# Patient Record
Sex: Male | Born: 1970
Health system: Southern US, Community
[De-identification: ages and names within clinical notes are randomized; demographics above are authoritative.]

## PROBLEM LIST (undated history)

## (undated) DIAGNOSIS — K219 Gastro-esophageal reflux disease without esophagitis: Secondary | ICD-10-CM

## (undated) HISTORY — PX: APPENDECTOMY: SHX54

---

## 2013-12-30 ENCOUNTER — Encounter (HOSPITAL_COMMUNITY): Payer: Self-pay | Admitting: Emergency Medicine

## 2013-12-30 ENCOUNTER — Emergency Department (HOSPITAL_COMMUNITY)
Admission: EM | Admit: 2013-12-30 | Discharge: 2013-12-30 | Disposition: A | Discharge status: Home / Self Care | Payer: 59 | Attending: Emergency Medicine | Admitting: Emergency Medicine

## 2013-12-30 DIAGNOSIS — X503XXA Overexertion from repetitive movements, initial encounter: Secondary | ICD-10-CM | POA: Insufficient documentation

## 2013-12-30 DIAGNOSIS — S43499A Other sprain of unspecified shoulder joint, initial encounter: Secondary | ICD-10-CM | POA: Insufficient documentation

## 2013-12-30 DIAGNOSIS — T148XXA Other injury of unspecified body region, initial encounter: Secondary | ICD-10-CM

## 2013-12-30 DIAGNOSIS — M62830 Muscle spasm of back: Secondary | ICD-10-CM

## 2013-12-30 DIAGNOSIS — S0993XA Unspecified injury of face, initial encounter: Secondary | ICD-10-CM | POA: Insufficient documentation

## 2013-12-30 DIAGNOSIS — S46819A Strain of other muscles, fascia and tendons at shoulder and upper arm level, unspecified arm, initial encounter: Principal | ICD-10-CM

## 2013-12-30 DIAGNOSIS — Y9389 Activity, other specified: Secondary | ICD-10-CM | POA: Insufficient documentation

## 2013-12-30 DIAGNOSIS — Y929 Unspecified place or not applicable: Secondary | ICD-10-CM | POA: Insufficient documentation

## 2013-12-30 DIAGNOSIS — S199XXA Unspecified injury of neck, initial encounter: Secondary | ICD-10-CM

## 2013-12-30 MED ORDER — HYDROCODONE-ACETAMINOPHEN 5-325 MG PO TABS: ORAL_TABLET | ORAL | Status: DC

## 2013-12-30 MED ORDER — IBUPROFEN 600 MG PO TABS: 600.0000 mg | ORAL_TABLET | Freq: Four times a day (QID) | ORAL | Status: DC | PRN

## 2013-12-30 MED ORDER — METHOCARBAMOL 500 MG PO TABS: 1000.0000 mg | ORAL_TABLET | Freq: Four times a day (QID) | ORAL | Status: DC

## 2013-12-30 NOTE — ED Provider Notes (Signed)
Medical screening examination/treatment/procedure(s) were performed by non-physician practitioner and as supervising physician I was immediately available for consultation/collaboration.  Richarda Blade, MD 12/30/13 [phone]

## 2013-12-30 NOTE — Discharge Instructions (Signed)
Please read and follow all provided instructions.  Your diagnoses today include:  1. Muscle spasm of back   2. Muscle strain     Tests performed today include:  Vital signs - see below for your results today  Medications prescribed:   Robaxin (methocarbamol) - muscle relaxer medication  DO NOT drive or perform any activities that require you to be awake and alert because this medicine can make you drowsy.    Ibuprofen (Motrin, Advil) - anti-inflammatory pain medication  Do not exceed 600mg  ibuprofen every 6 hours, take with food  You have been prescribed an anti-inflammatory medication or NSAID. Take with food. Take smallest effective dose for the shortest duration needed for your pain. Stop taking if you experience stomach pain or vomiting.    Vicodin (hydrocodone/acetaminophen) - narcotic pain medication  DO NOT drive or perform any activities that require you to be awake and alert because this medicine can make you drowsy. BE VERY CAREFUL not to take multiple medicines containing Tylenol (also called acetaminophen). Doing so can lead to an overdose which can damage your liver and cause liver failure and possibly death.  Take any prescribed medications only as directed.  Home care instructions:   Follow any educational materials contained in this packet  Please rest, use ice or heat on your back for the next several days  Do not lift, push, pull anything more than 10 pounds for the next week  Follow-up instructions: Please follow-up with your primary care provider in the next 1 week for further evaluation of your symptoms.   Return instructions:  SEEK IMMEDIATE MEDICAL ATTENTION IF YOU HAVE:  New numbness, tingling, weakness, or problem with the use of your arms or legs  Severe back pain not relieved with medications  Loss control of your bowels or bladder  Increasing pain in any areas of the body (such as chest or abdominal pain)  Shortness of breath,  dizziness, or fainting.   Worsening nausea (feeling sick to your stomach), vomiting, fever, or sweats  Any other emergent concerns regarding your health   Additional Information:  Your vital signs today were: BP 105/67   Pulse 58   Temp(Src) 97.9 F (36.6 C) (Oral)   Resp 20   Ht 6' (1.829 m)   Wt 205 lb (92.987 kg)   BMI 27.80 kg/m2   SpO2 100% If your blood pressure (BP) was elevated above 135/85 this visit, please have this repeated by your doctor within one month. --------------

## 2013-12-30 NOTE — ED Provider Notes (Signed)
CSN: [id number]     Arrival date & time 12/30/13  1026 History  This chart was scribed for non-physician practitioner, Alecia Lemming, PA-C working with Richarda Blade, MD, by Erling Conte, ED Scribe. This patient was seen in room TR08C/TR08C and the patient's care was started at 11:15 AM.    Chief Complaint  Patient presents with  . Arm Pain  . Neck Injury    The history is provided by the patient. No language interpreter was used.   HPI Comments: Jonathon Johnston is a 43 y.o. male who presents to the Emergency Department complaining of a constant, gradually worsening, "aching and stiff", "7/10", neck pain. He states that yesterday morning he was doing lifting and weight training but denies any injury to his neck that he is aware of. He states that he was doing chin ups, pull downs, lower back lifting exercises and the rowing machine when he could have possibly injured his neck. He states that he came home and relaxed and iced his neck when it started to hurt. He states that his wife applied icy hot and states that around 2:00 PM yesterday he was unable to move his neck at all. He also states that the pain started to radiate down his right arm and his right arm feels "heavier than normal". He denies any h/o neck injuries. He states that he works our regularly and was not pushing himself harder than normal. He states that he also took Tylenol with mild relief. He denies any numbness, tingling, vision changes, HA, or weakness in his right arm or hand.      No past medical history on file. No past surgical history on file. No family history on file. History  Substance Use Topics  . Smoking status: Never Smoker   . Smokeless tobacco: Not on file  . Alcohol Use: No    Review of Systems  Eyes: Negative for visual disturbance.  Musculoskeletal: Positive for myalgias (right arm pain due to neck injury) and neck pain.  Neurological: Negative for weakness, numbness and headaches.       Allergies  Review of patient's allergies indicates not on file.  Home Medications   Prior to Admission medications   Not on File   Triage Vitals: BP 105/67  Pulse 58  Temp(Src) 97.9 F (36.6 C) (Oral)  Resp 20  Ht 6' (1.829 m)  Wt 205 lb (92.987 kg)  BMI 27.80 kg/m2  SpO2 100%  Physical Exam  Nursing note and vitals reviewed. Constitutional: He appears well-developed and well-nourished. No distress.  HENT:  Head: Normocephalic and atraumatic.  Eyes: Conjunctivae and EOM are normal.  Neck: Normal range of motion. Neck supple. No tracheal deviation present.  Cardiovascular: Normal rate.   Pulmonary/Chest: Effort normal. No respiratory distress.  Abdominal: Soft. There is no tenderness. There is no CVA tenderness.  Musculoskeletal: Normal range of motion.       Right shoulder: Normal.       Left shoulder: Normal.       Right elbow: Normal.      Right wrist: Normal.       Cervical back: He exhibits tenderness and spasm. He exhibits normal range of motion and no bony tenderness.       Back:       Right upper arm: Normal.       Right forearm: Normal.       Right hand: He exhibits normal capillary refill.  5/5 strength in his upper extremities  Neurological: He  is alert. He has normal reflexes. No sensory deficit. He exhibits normal muscle tone.  5/5 strength in entire lower extremities bilaterally. No sensation deficit.   Skin: Skin is warm and dry.  Psychiatric: He has a normal mood and affect. His behavior is normal.    ED Course  Procedures (including critical care time)  DIAGNOSTIC STUDIES: Oxygen Saturation is 100% on RA, normal by my interpretation.    COORDINATION OF CARE: 11:21 AM- Will discharge pt with Robaxin, Motrin and Vicodin.  Pt advised of plan for treatment and pt agrees.     Labs Review Labs Reviewed - No data to display  Imaging Review No results found.   EKG Interpretation None      11:34 AM Patient seen and examined.    Vital signs reviewed and are as follows: Filed Vitals:   12/30/13 1056  BP: 105/67  Pulse: 58  Temp: 97.9 F (36.6 C)  Resp: 20   Patient was counseled on back pain precautions and told to do activity as tolerated but do not lift, push, or pull heavy objects more than 10 pounds for the next week.  Patient counseled to use ice or heat on back for no longer than 15 minutes every hour.   Patient prescribed muscle relaxer and counseled on proper use of muscle relaxant medication.    Patient prescribed narcotic pain medicine and counseled on proper use of narcotic pain medications. Counseled not to combine this medication with others containing tylenol.   Urged patient not to drink alcohol, drive, or perform any other activities that requires focus while taking either of these medications.  Patient urged to follow-up with PCP if pain does not improve with treatment and rest or if pain becomes recurrent. Urged to return with worsening severe pain, loss of bowel or bladder control, trouble walking.   The patient verbalizes understanding and agrees with the plan.  MDM   Final diagnoses:  Muscle spasm of back  Muscle strain   Patients with trapezius tenderness and spasm that began after lifting weights. His upper extremities are neurovascularly intact. Do not suspect significant vascular or nerve injury at this time. Will treat muscle spasm. Patient counseled to rest and not do any heavy lifting.   I personally performed the services described in this documentation, which was scribed in my presence. The recorded information has been reviewed and is accurate.     Carlisle Cater, PA-C 12/30/13 1135

## 2013-12-30 NOTE — ED Notes (Signed)
Pt was lifting and doing weight training yesterday and today has neck, shoulder and right arm pain and arm heaviness. sts possible pinched nerve, describes as ache

## 2014-02-18 ENCOUNTER — Encounter: Payer: Self-pay | Admitting: Sports Medicine

## 2014-02-18 ENCOUNTER — Ambulatory Visit (INDEPENDENT_AMBULATORY_CARE_PROVIDER_SITE_OTHER): Payer: 59 | Admitting: Sports Medicine

## 2014-02-18 VITALS — BP 117/81 | HR 64 | Ht 72.0 in | Wt 195.0 lb

## 2014-02-18 DIAGNOSIS — M2142 Flat foot [pes planus] (acquired), left foot: Secondary | ICD-10-CM

## 2014-02-18 DIAGNOSIS — M2141 Flat foot [pes planus] (acquired), right foot: Secondary | ICD-10-CM | POA: Insufficient documentation

## 2014-02-18 DIAGNOSIS — M79609 Pain in unspecified limb: Secondary | ICD-10-CM

## 2014-02-18 DIAGNOSIS — M2140 Flat foot [pes planus] (acquired), unspecified foot: Secondary | ICD-10-CM

## 2014-02-18 DIAGNOSIS — M79651 Pain in right thigh: Secondary | ICD-10-CM | POA: Insufficient documentation

## 2014-02-18 NOTE — Progress Notes (Signed)
   Subjective:    Patient ID: Jonathon Johnston, male    DOB: May 27, 1971, 43 y.o.   MRN: [id number]  HPI Jonathon Johnston is a 43 year old male who presents with right thigh pain. He notes onset of thigh pain 3 weeks ago it was gradual without any known acute injury. Location is primarily the superoanterior aspect of his right thigh. It is aggravated with running or pressing on his upper thigh in the area of the deep inguinal folds and maybe a little more inferior over the anterior quadriceps. He noticed that he has been increasing his mileage as of late. He has been running 50-60 miles a week in preparation for a possible 50-100 mile race planned in a year and a half. He also purchased a running shoes 3 weeks ago. He also has chronic bilateral pes planus. Character of pain is an intermittent sharp pain. He is tried ice, heat, and reduction in weekly mileage with some relief of symptoms. He denies any swelling, fevers, chills, sensation of a pop or give way, numbness, tingling, or weakness.  Past medical, social, medications, and allergies were reviewed and are up-to-date in the chart. Review of Systems 11 point review of systems was performed is otherwise negative.    Objective:   Physical Exam BP 117/81  Pulse 64  Ht 6' (1.829 m)  Wt 195 lb (88.451 kg)  BMI 26.44 kg/m2 GEN: The patient is well-developed well-nourished male and in no acute distress.  He is awake alert and oriented x3. SKIN: warm and well-perfused, no rash  EXTR: No lower extremity edema or calf tenderness Neuro: Strength 5/5 globally. Sensation intact throughout. DTRs 2/4 bilaterally. No focal deficits. Vasc: +2 bilateral distal pulses. No edema.  MSK: Examination of the bilateral thighs reveals symmetric thigh size at 21 cm approximately 5 cm superior to the bilateral knees. Negative fulcrum test of the femur. Slightly weakened quadriceps on the right with pain. Positive hop test on the right. Gait analysis reveals bilateral  hyperpronation, though right greater than left. Bilateral pes planus deformity is seen. There is slight splaying between the first and second toe with increased pressure on the medial foot. His current insoles provide minimal arch support. His gait was correctable after placing bilateral scaphoid pads over his insoles. He has discrete tenderness palpation over the anterior midline thigh rectus femoris tendinous region. There is no overlying bruising or erythema. There is no femoral hernia palpable. Negative pelvic compression test. He has normal internal and external rotation at the hip bilaterally without pain.    Assessment & Plan:  Please see problem based assessment and plan in the problem list.

## 2014-02-18 NOTE — Assessment & Plan Note (Signed)
-  Bilateral scaphoid pads added to his shoe insoles with improved running gait with less pronation.

## 2014-02-18 NOTE — Assessment & Plan Note (Signed)
-  We'll obtain x-rays of the femur and pelvis to rule out stress fracture or pubic ramus fracture. -If negative, we will treat him as a quadriceps tendon strain and/or stress reaction. -Recommend no running or impact activities for 3 weeks. He may bike, elliptical, or swim. -Recommend that he wear bicycle compression shorts for comfort. -He'll followup in 3 weeks where we will reassess.

## 2014-02-18 NOTE — Patient Instructions (Addendum)
-  We will obtain x-rays to evaluate for stress fracture or avulsion. -If x-rays negative, we will treat as we would any quadriceps strain and stress reaction with 3 weeks of non-impact sport activities (no running or jumping), but you may ride a bicycle, elliptical, or swim. -Recommend wearing bicycle shorts for compression -Added scaphoid pads for flat arches. -May also do gentle quadriceps stretching exercises -Follow-up in 3 weeks

## 2014-03-11 ENCOUNTER — Ambulatory Visit: Payer: 59 | Admitting: Sports Medicine

## 2015-08-25 DIAGNOSIS — K6289 Other specified diseases of anus and rectum: Secondary | ICD-10-CM | POA: Diagnosis not present

## 2015-09-22 MED FILL — OMEPRAZOLE DR 20 MG CAPSULE: 20 | 90 days supply | Qty: 90 | Fill #0

## 2015-10-23 DIAGNOSIS — K6289 Other specified diseases of anus and rectum: Secondary | ICD-10-CM | POA: Diagnosis not present

## 2015-11-03 MED FILL — GAVILYTE-N SOLUTION: 420 | 1 days supply | Qty: 4000 | Fill #0

## 2015-11-12 DIAGNOSIS — D126 Benign neoplasm of colon, unspecified: Secondary | ICD-10-CM | POA: Diagnosis not present

## 2015-11-12 DIAGNOSIS — D124 Benign neoplasm of descending colon: Secondary | ICD-10-CM | POA: Diagnosis not present

## 2015-11-12 DIAGNOSIS — K6289 Other specified diseases of anus and rectum: Secondary | ICD-10-CM | POA: Diagnosis not present

## 2015-11-12 DIAGNOSIS — K648 Other hemorrhoids: Secondary | ICD-10-CM | POA: Diagnosis not present

## 2015-11-12 DIAGNOSIS — R194 Change in bowel habit: Secondary | ICD-10-CM | POA: Diagnosis not present

## 2015-11-13 MED FILL — ANUCORT-HC 25 MG SUPP: 25 | 30 days supply | Qty: 30 | Fill #0

## 2016-02-02 MED FILL — OMEPRAZOLE DR 20 MG CAPSULE: 20 | 90 days supply | Qty: 90 | Fill #0

## 2016-06-17 MED FILL — OMEPRAZOLE DR 20 MG CAPSULE: 20 | 90 days supply | Qty: 90 | Fill #0

## 2016-07-18 MED FILL — ANUCORT-HC 25 MG SUPP: 25 | 30 days supply | Qty: 30 | Fill #1

## 2016-08-29 DIAGNOSIS — M25511 Pain in right shoulder: Secondary | ICD-10-CM | POA: Diagnosis not present

## 2016-08-29 MED FILL — NAPROXEN 500 MG TABLET: 500 | 15 days supply | Qty: 30 | Fill #0

## 2016-09-13 DIAGNOSIS — L52 Erythema nodosum: Secondary | ICD-10-CM | POA: Diagnosis not present

## 2016-09-13 MED FILL — predniSONE 20 MG TABS: 20 | 7 days supply | Qty: 7 | Fill #0

## 2016-09-14 DIAGNOSIS — M25511 Pain in right shoulder: Secondary | ICD-10-CM | POA: Diagnosis not present

## 2016-09-15 ENCOUNTER — Other Ambulatory Visit (HOSPITAL_COMMUNITY): Payer: Self-pay | Admitting: Orthopedic Surgery

## 2016-09-15 DIAGNOSIS — M67911 Unspecified disorder of synovium and tendon, right shoulder: Secondary | ICD-10-CM

## 2016-09-23 ENCOUNTER — Ambulatory Visit (HOSPITAL_COMMUNITY)
Admission: RE | Admit: 2016-09-23 | Discharge: 2016-09-23 | Disposition: A | Discharge status: Home / Self Care | Payer: 59 | Source: Ambulatory Visit | Attending: Orthopedic Surgery | Admitting: Orthopedic Surgery

## 2016-09-23 DIAGNOSIS — M67911 Unspecified disorder of synovium and tendon, right shoulder: Secondary | ICD-10-CM | POA: Insufficient documentation

## 2016-09-23 DIAGNOSIS — M19011 Primary osteoarthritis, right shoulder: Secondary | ICD-10-CM | POA: Diagnosis not present

## 2016-09-23 DIAGNOSIS — M7581 Other shoulder lesions, right shoulder: Secondary | ICD-10-CM | POA: Diagnosis not present

## 2016-09-23 DIAGNOSIS — M25511 Pain in right shoulder: Secondary | ICD-10-CM | POA: Diagnosis not present

## 2016-09-26 DIAGNOSIS — M67911 Unspecified disorder of synovium and tendon, right shoulder: Secondary | ICD-10-CM | POA: Diagnosis not present

## 2016-09-27 DIAGNOSIS — L52 Erythema nodosum: Secondary | ICD-10-CM | POA: Diagnosis not present

## 2016-09-27 DIAGNOSIS — D485 Neoplasm of uncertain behavior of skin: Secondary | ICD-10-CM | POA: Diagnosis not present

## 2016-09-27 DIAGNOSIS — D225 Melanocytic nevi of trunk: Secondary | ICD-10-CM | POA: Diagnosis not present

## 2016-09-27 MED FILL — CLOBETASOL 0.05% CREAM: 0.05 | 20 days supply | Qty: 60 | Fill #0

## 2016-10-12 ENCOUNTER — Ambulatory Visit: Payer: 59 | Attending: Orthopedic Surgery

## 2016-10-12 DIAGNOSIS — M7541 Impingement syndrome of right shoulder: Secondary | ICD-10-CM | POA: Diagnosis not present

## 2016-10-12 DIAGNOSIS — M67911 Unspecified disorder of synovium and tendon, right shoulder: Secondary | ICD-10-CM | POA: Insufficient documentation

## 2016-10-12 DIAGNOSIS — S46811A Strain of other muscles, fascia and tendons at shoulder and upper arm level, right arm, initial encounter: Secondary | ICD-10-CM | POA: Insufficient documentation

## 2016-10-12 DIAGNOSIS — X58XXXA Exposure to other specified factors, initial encounter: Secondary | ICD-10-CM | POA: Insufficient documentation

## 2016-10-12 DIAGNOSIS — M25511 Pain in right shoulder: Secondary | ICD-10-CM | POA: Insufficient documentation

## 2016-10-12 DIAGNOSIS — M6281 Muscle weakness (generalized): Secondary | ICD-10-CM | POA: Diagnosis not present

## 2016-10-12 DIAGNOSIS — M62838 Other muscle spasm: Secondary | ICD-10-CM | POA: Insufficient documentation

## 2016-10-12 NOTE — Therapy (Signed)
[place], [place], [address] Phone: [phone]   Fax:  [phone]  Physical Therapy Evaluation  Patient Details  Name: Jonathon Johnston MRN: [id number] Date of Birth: Mar 27, 1971 Referring Provider: Tania Ade, MD  Encounter Date: 10/12/2016      PT End of Session - 10/12/16 1458    Visit Number 1   Number of Visits 13   Date for PT Re-Evaluation 11/25/16   Authorization Type UMR   PT Start Time 0222   PT Stop Time 0300   PT Time Calculation (min) 38 min   Activity Tolerance Patient tolerated treatment well;No increased pain   Behavior During Therapy [facility] for tasks assessed/performed      History reviewed. No pertinent past medical history.  History reviewed. No pertinent surgical history.  There were no vitals filed for this visit.       Subjective Assessment - 10/12/16 1423    Subjective He reports RT shoulder pain without RTC tear . He reports lateral shoulder raises began to have snap . Rested 2 weeks then saw MD due to continued pain.   MD feels deltoid tear.   PAin with reaching . Steroid in jection with no benefit.    Limitations --  Reaching overhead and out to front and side(less so). worse with weight.    How long can you sit comfortably? Na   How long can you stand comfortably? NA   How long can you walk comfortably? NA   Diagnostic tests MRI No damage possible deltoid tear.    Patient Stated Goals he wants to have shoulder recover and return to weight training.    Currently in Pain? No/denies  at rest , with movment. highest  8-9/10 .     Pain Location Shoulder   Pain Orientation Right;Posterior  deep   Pain Descriptors / Indicators Sharp   Pain Type --  sub acute 6-8 weeks   Pain Onset More than a month ago   Pain Frequency Intermittent   Aggravating Factors  reaching , lifting   Pain Relieving Factors ice , rest   Multiple Pain Sites No            [facility] PT  Assessment - 10/12/16 0001      Assessment   Medical Diagnosis Rt deltoid tear   Referring Provider Tania Ade, MD   Onset Date/Surgical Date --  6-8 weeks ago   Hand Dominance Right   Next MD Visit next week   Prior Therapy No     Precautions   Precautions None     Restrictions   Weight Bearing Restrictions No     Balance Screen   Has the patient fallen in the past 6 months No     Prior Function   Level of Independence Independent     Cognition   Overall Cognitive Status Within Functional Limits for tasks assessed     Posture/Postural Control   Posture Comments WNL     ROM / Strength   AROM / PROM / Strength AROM;Strength     AROM   Overall AROM Comments WNL and equal to Lt but with pain on Hor Add more than Abduct,  ER  at 90 degrees abduction.     AROM Assessment Site Shoulder   Right/Left Shoulder Right;Left     Strength   Overall Strength Comments RT shoulder ER with shoulder 90 degrees abduciton 4+/5 with pain.  No pain on MMT with shoulders below 90  degrees .       Palpation   Palpation comment tender posterior deltoid     Ambulation/Gait   Gait Comments Normal                   [facility] Adult PT Treatment/Exercise - 10/12/16 0001      Exercises   Exercises Shoulder     Modalities   Modalities Iontophoresis     Iontophoresis   Type of Iontophoresis Dexamethasone   Location posterior RT shoulder   Dose 1cc   Time 4-6 hours                PT Education - 10/12/16 1628    Education provided Yes   Education Details POC , HEP    Person(s) Educated Patient   Methods Explanation;Demonstration;Tactile cues;Verbal cues;Handout   Comprehension Returned demonstration;Verbalized understanding          PT Short Term Goals - 10/12/16 1501      PT SHORT TERM GOAL #1   Title He will b eindependent with inital HEP   Time 3   Period Weeks   Status New     PT SHORT TERM GOAL #2   Title He will be able to report pain decreased  30% or more with reaching   Time 3   Period Weeks   Status New     PT SHORT TERM GOAL #3   Title he will demo understanding of good posture to decr pressure to soft tisues in shoulder   Time 3   Period Weeks   Status New           PT Long Term Goals - 10/12/16 1503      PT LONG TERM GOAL #1   Title He will be independent with all HEP   Time 6   Period Weeks   Status New     PT LONG TERM GOAL #2   Title He will be able to resume lateral raises without pain to 90 degrees on RT.    Time 6   Period Weeks   Status New     PT LONG TERM GOAL #3   Title He will have full active RT shoulder ROM without pain.    Time 6   Period Weeks   Status New     PT LONG TERM GOAL #4   Title He will be able to lift 20 pounds with both arms and RT arm alone 10 pounds overhead with min to no pain   Time 6   Period Weeks   Status New               Plan - 10/12/16 1459    Clinical Impression Statement Mr Cobern presents for low complexity eval with RT shoulder pain with mild weakness with pain nad linmited ability to reach and lift due to pain.    Rehab Potential Good   PT Frequency 2x / week   PT Duration 6 weeks   PT Treatment/Interventions Iontophoresis 4mg /ml Dexamethasone;Ultrasound;Patient/family education;Manual techniques;Taping;Therapeutic exercise;Dry needling;Cryotherapy   PT Next Visit Plan REview HEP STW, ionto if no problem , taping,    PT Home Exercise Plan Rhomboid stretch , rockwood, STW with tennis ball   Consulted and Agree with Plan of Care Patient      Patient will benefit from skilled therapeutic intervention in order to improve the following deficits and impairments:  Pain, Increased muscle spasms, Impaired UE functional use, Decreased activity tolerance, Decreased strength  Visit  Diagnosis: Right shoulder pain, unspecified chronicity - Plan: PT plan of care cert/re-cert  Muscle weakness (generalized) - Plan: PT plan of care cert/re-cert  Other  muscle spasm - Plan: PT plan of care cert/re-cert  Impingement syndrome of right shoulder - Plan: PT plan of care cert/re-cert  Strain of right deltoid muscle, initial encounter - Plan: PT plan of care cert/re-cert     Problem List Patient Active Problem List   Diagnosis Date Noted  . Right thigh pain 02/18/2014  . Pes planus of both feet 02/18/2014    [name]  PT 10/12/2016, 4:36 PM  [facility] [address] [place], [place], [address] Phone: [phone]   Fax:  [phone]  Name: Jonathon Johnston MRN: [id number] Date of Birth: February 10, 1971

## 2016-10-12 NOTE — Patient Instructions (Signed)
Issued from [place] with blue band and rhomboid stretch and instructed use of ball for STW to posterior deltoid

## 2016-10-17 ENCOUNTER — Ambulatory Visit: Payer: 59

## 2016-10-17 DIAGNOSIS — M25511 Pain in right shoulder: Secondary | ICD-10-CM | POA: Diagnosis not present

## 2016-10-17 DIAGNOSIS — M67911 Unspecified disorder of synovium and tendon, right shoulder: Secondary | ICD-10-CM | POA: Diagnosis not present

## 2016-10-17 DIAGNOSIS — S46811A Strain of other muscles, fascia and tendons at shoulder and upper arm level, right arm, initial encounter: Secondary | ICD-10-CM

## 2016-10-17 DIAGNOSIS — M6281 Muscle weakness (generalized): Secondary | ICD-10-CM | POA: Diagnosis not present

## 2016-10-17 DIAGNOSIS — M7541 Impingement syndrome of right shoulder: Secondary | ICD-10-CM

## 2016-10-17 DIAGNOSIS — M62838 Other muscle spasm: Secondary | ICD-10-CM | POA: Diagnosis not present

## 2016-10-17 NOTE — Therapy (Signed)
[place], [place], [address] Phone: [phone]   Fax:  [phone]  Physical Therapy Treatment  Patient Details  Name: LOWERY PAULLIN MRN: [id number] Date of Birth: [birth year] Referring Provider: Tania Ade, MD  Encounter Date: 10/17/2016      PT End of Session - 10/17/16 1058    Visit Number 2   Number of Visits 13   Date for PT Re-Evaluation 11/25/16   Authorization Type UMR   PT Start Time 1015   PT Stop Time 1057   PT Time Calculation (min) 42 min   Activity Tolerance Patient tolerated treatment well   Behavior During Therapy [facility] for tasks assessed/performed      History reviewed. No pertinent past medical history.  History reviewed. No pertinent surgical history.  There were no vitals filed for this visit.      Subjective Assessment - 10/17/16 1020    Subjective More sore . possibly from Exercise . Exercise 3x/day (asked him to decr by 1/3, issued green band)   Currently in Pain? No/denies  today at rest.                          [facility] Adult PT Treatment/Exercise - 10/17/16 0001      Shoulder Exercises: ROM/Strengthening   UBE (Upper Arm Bike) L2 35min forward , 3 back     Modalities   Modalities Ultrasound     Ultrasound   Ultrasound Location posterior RT shoulder    Ultrasound Parameters 100% !MHz 1.6 Wcm2.    Ultrasound Goals Pain     Iontophoresis   Type of Iontophoresis Dexamethasone   Location posterior RT shoulder   Dose 1cc   Time 4-6 hours     Manual Therapy   Manual Therapy Soft tissue mobilization;Joint mobilization   Joint Mobilization INf and posterior glides RT shoulder GR 2-3   Soft tissue mobilization STW with and without tool to posterior RT shoulder       Reviewed HEP and modified to see if can decr soreness            PT Short Term Goals - 10/12/16 1501      PT SHORT TERM GOAL #1   Title He will b eindependent with  inital HEP   Time 3   Period Weeks   Status New     PT SHORT TERM GOAL #2   Title He will be able to report pain decreased 30% or more with reaching   Time 3   Period Weeks   Status New     PT SHORT TERM GOAL #3   Title he will demo understanding of good posture to decr pressure to soft tisues in shoulder   Time 3   Period Weeks   Status New           PT Long Term Goals - 10/12/16 1503      PT LONG TERM GOAL #1   Title He will be independent with all HEP   Time 6   Period Weeks   Status New     PT LONG TERM GOAL #2   Title He will be able to resume lateral raises without pain to 90 degrees on RT.    Time 6   Period Weeks   Status New     PT LONG TERM GOAL #3   Title He will have full active RT shoulder ROM without pain.  Time 6   Period Weeks   Status New     PT LONG TERM GOAL #4   Title He will be able to lift 20 pounds with both arms and RT arm alone 10 pounds overhead with min to no pain   Time 6   Period Weeks   Status New               Plan - 10/17/16 1059    Clinical Impression Statement He was less tender after session. Decr HEP to see if this will decr soreness.      PT Treatment/Interventions Iontophoresis 4mg /ml Dexamethasone;Ultrasound;Patient/family education;Manual techniques;Taping;Therapeutic exercise;Dry needling;Cryotherapy   PT Next Visit Plan He is able to do HEP. [place] , STW and modalities   PT Home Exercise Plan Rhomboid stretch , rockwood, STW with tennis ball   Consulted and Agree with Plan of Care Patient      Patient will benefit from skilled therapeutic intervention in order to improve the following deficits and impairments:  Pain, Increased muscle spasms, Impaired UE functional use, Decreased activity tolerance, Decreased strength  Visit Diagnosis: Right shoulder pain, unspecified chronicity  Muscle weakness (generalized)  Other muscle spasm  Impingement syndrome of right shoulder  Strain of right deltoid  muscle, initial encounter     Problem List Patient Active Problem List   Diagnosis Date Noted  . Right thigh pain 02/18/2014  . Pes planus of both feet 02/18/2014    [name]  PT  10/17/2016, 11:01 AM  [facility] [address] [place], [place], [address] Phone: [phone]   Fax:  [phone]  Name: JAMARL PEW MRN: [id number] Date of Birth: Mar 08, 1971

## 2016-10-20 ENCOUNTER — Telehealth: Payer: Self-pay

## 2016-10-20 ENCOUNTER — Ambulatory Visit: Payer: 59

## 2016-10-20 NOTE — Telephone Encounter (Signed)
Message left reminding of missed appointment today and next visit next week 10/25/16

## 2016-10-25 ENCOUNTER — Ambulatory Visit: Payer: 59

## 2016-10-25 DIAGNOSIS — S46811A Strain of other muscles, fascia and tendons at shoulder and upper arm level, right arm, initial encounter: Secondary | ICD-10-CM | POA: Diagnosis not present

## 2016-10-25 DIAGNOSIS — M67911 Unspecified disorder of synovium and tendon, right shoulder: Secondary | ICD-10-CM | POA: Diagnosis not present

## 2016-10-25 DIAGNOSIS — M6281 Muscle weakness (generalized): Secondary | ICD-10-CM | POA: Diagnosis not present

## 2016-10-25 DIAGNOSIS — M25511 Pain in right shoulder: Secondary | ICD-10-CM

## 2016-10-25 DIAGNOSIS — M7541 Impingement syndrome of right shoulder: Secondary | ICD-10-CM | POA: Diagnosis not present

## 2016-10-25 DIAGNOSIS — M62838 Other muscle spasm: Secondary | ICD-10-CM | POA: Diagnosis not present

## 2016-10-25 NOTE — Patient Instructions (Signed)
[place] Hor abduction and extension 1-2x/day 2-3 pounds 3x10 with pain as guide.

## 2016-10-25 NOTE — Therapy (Signed)
Jonathon Johnston, [place], [address] Phone: [phone]   Fax:  [phone]  Physical Therapy Treatment  Patient Details  Name: Jonathon Johnston MRN: [id number] Date of Birth: [birth year] Referring Provider: Tania Ade, MD  Encounter Date: 10/25/2016      PT End of Session - 10/25/16 1021    Visit Number 3   Number of Visits 13   Date for PT Re-Evaluation 11/25/16   Authorization Type UMR   PT Start Time 1021   PT Stop Time 1100   PT Time Calculation (min) 39 min   Activity Tolerance Patient tolerated treatment well;No increased pain   Behavior During Therapy [facility] for tasks assessed/performed      History reviewed. No pertinent past medical history.  History reviewed. No pertinent surgical history.  There were no vitals filed for this visit.      Subjective Assessment - 10/25/16 1029    Subjective Achey today . No worse pain compared to last week                          [facility] Adult PT Treatment/Exercise - 10/25/16 0001      Shoulder Exercises: Prone   Horizontal ABduction 1 Right;10 reps   Horizontal ABduction 1 Weight (lbs) 2     Shoulder Exercises: Standing   Flexion Right;Left;10 reps  3 sets 3 pounds   Shoulder Flexion Weight (lbs) 3   ABduction Both;10 reps  3 sets   Shoulder ABduction Weight (lbs) 3     Shoulder Exercises: ROM/Strengthening   UBE (Upper Arm Bike) L3 3 min for 3 back     Ultrasound   Ultrasound Location post RT shhoulder at deltoid   Ultrasound Parameters 100% 1 MHz 1.2 Wcm2   Ultrasound Goals Pain     Iontophoresis   Type of Iontophoresis Dexamethasone   Location posterior RT shoulder   Dose 1cc   Time 4-6 hours     Manual Therapy   Soft tissue mobilization STW with and without tool to posterior RT shoulder                 PT Education - 10/25/16 1102    Education provided Yes   Education Details prone houhgston exer   Person(s) Educated Patient   Methods Explanation;Demonstration;Tactile cues;Verbal cues;Handout   Comprehension Returned demonstration;Verbalized understanding          PT Short Term Goals - 10/25/16 1246      PT SHORT TERM GOAL #1   Title He will be independent with inital HEP   Status Achieved     PT SHORT TERM GOAL #2   Title He will be able to report pain decreased 30% or more with reaching   Status On-going           PT Long Term Goals - 10/12/16 1503      PT LONG TERM GOAL #1   Title He will be independent with all HEP   Time 6   Period Weeks   Status New     PT LONG TERM GOAL #2   Title He will be able to resume lateral raises without pain to 90 degrees on RT.    Time 6   Period Weeks   Status New     PT LONG TERM GOAL #3   Title He will have full active RT shoulder ROM without pain.    Time 6   Period  Weeks   Status New     PT LONG TERM GOAL #4   Title He will be able to lift 20 pounds with both arms and RT arm alone 10 pounds overhead with min to no pain   Time 6   Period Weeks   Status New               Plan - 10/25/16 1026    Clinical Impression Statement Still spot tender and pain limts  range and wieght for exercises.     PT Treatment/Interventions Iontophoresis 4mg /ml Dexamethasone;Ultrasound;Patient/family education;Manual techniques;Taping;Therapeutic exercise;Dry needling;Cryotherapy   PT Next Visit Plan He is able to do HEP. Ionto , STW and modalities   PT Home Exercise Plan Rhomboid stretch , rockwood, STW with tennis ball   Consulted and Agree with Plan of Care Patient      Patient will benefit from skilled therapeutic intervention in order to improve the following deficits and impairments:  Pain, Increased muscle spasms, Impaired UE functional use, Decreased activity tolerance, Decreased strength  Visit Diagnosis: Right shoulder pain, unspecified chronicity  Muscle weakness (generalized)  Other muscle spasm  Impingement  syndrome of right shoulder  Strain of right deltoid muscle, initial encounter     Problem List Patient Active Problem List   Diagnosis Date Noted  . Right thigh pain 02/18/2014  . Pes planus of both feet 02/18/2014    Jonathon Johnston PT 10/25/2016, 12:47 PM  [facility] [address] [place], [place], [address] Phone: [phone]   Fax:  [phone]  Name: Jonathon Johnston MRN: [id number] Date of Birth: Feb 23, 1971

## 2016-10-27 ENCOUNTER — Ambulatory Visit: Payer: 59

## 2016-11-01 ENCOUNTER — Ambulatory Visit: Payer: 59 | Attending: Orthopedic Surgery

## 2016-11-01 DIAGNOSIS — S46811A Strain of other muscles, fascia and tendons at shoulder and upper arm level, right arm, initial encounter: Secondary | ICD-10-CM | POA: Diagnosis not present

## 2016-11-01 DIAGNOSIS — M62838 Other muscle spasm: Secondary | ICD-10-CM | POA: Diagnosis not present

## 2016-11-01 DIAGNOSIS — M7541 Impingement syndrome of right shoulder: Secondary | ICD-10-CM | POA: Diagnosis not present

## 2016-11-01 DIAGNOSIS — M25511 Pain in right shoulder: Secondary | ICD-10-CM | POA: Diagnosis not present

## 2016-11-01 DIAGNOSIS — M6281 Muscle weakness (generalized): Secondary | ICD-10-CM | POA: Insufficient documentation

## 2016-11-01 DIAGNOSIS — X58XXXA Exposure to other specified factors, initial encounter: Secondary | ICD-10-CM | POA: Insufficient documentation

## 2016-11-01 NOTE — Therapy (Signed)
[place], [place], [address] Phone: [phone]   Fax:  [phone]  Physical Therapy Treatment  Patient Details  Name: Jonathon Johnston MRN: [id number] Date of Birth: 1971/03/19 Referring Provider: Tania Ade, MD  Encounter Date: 11/01/2016      PT End of Session - 11/01/16 0933    Visit Number 4   Number of Visits 13   Date for PT Re-Evaluation 11/25/16   Authorization Type UMR   PT Start Time 0932   PT Stop Time 1020   PT Time Calculation (min) 48 min   Activity Tolerance Patient tolerated treatment well   Behavior During Therapy [facility] for tasks assessed/performed      History reviewed. No pertinent past medical history.  History reviewed. No pertinent surgical history.  There were no vitals filed for this visit.      Subjective Assessment - 11/01/16 0936    Subjective No pain today. last couple of days have not been bad. Occasioal catching but less frequent.    Currently in Pain? No/denies            [facility] PT Assessment - 11/01/16 0001      Strength   Overall Strength Comments 5-/5 ER at 90 degrees abduction so improved with less pain.                      [facility] Adult PT Treatment/Exercise - 11/01/16 0001      Shoulder Exercises: Prone   Flexion Right;10 reps   Flexion Weight (lbs) 5   Flexion Limitations 3 sets   Extension Right;10 reps  3 sets 5 pounds   Horizontal ABduction 1 Right  30 reps   Horizontal ABduction 1 Weight (lbs) 0     Shoulder Exercises: Standing   Flexion Right;Left;10 reps   Shoulder Flexion Weight (lbs) 3   Flexion Limitations Also with 5 pounds 3x10   ABduction Right;Left;10 reps   Shoulder ABduction Weight (lbs) 3     Shoulder Exercises: ROM/Strengthening   UBE (Upper Arm Bike) L3.5  3 min forward 3 back     Ultrasound   Ultrasound Location Post/lat RT shoulder   Ultrasound Parameters 100% 1MHz, 1,5 Wc2     Iontophoresis   Type of Iontophoresis Dexamethasone   Location posterior RT shoulder   Dose 1cc   Time 4-6 hours     Manual Therapy   Soft tissue mobilization STW with and without tool to posterior RT shoulder                   PT Short Term Goals - 11/01/16 0934      PT SHORT TERM GOAL #1   Title He will be independent with inital HEP   Status Achieved     PT SHORT TERM GOAL #2   Title He will be able to report pain decreased 30% or more with reaching   Status Achieved     PT SHORT TERM GOAL #3   Title he will demo understanding of good posture to decr pressure to soft tisues in shoulder   Status Achieved           PT Long Term Goals - 10/12/16 1503      PT LONG TERM GOAL #1   Title He will be independent with all HEP   Time 6   Period Weeks   Status New     PT LONG TERM GOAL #2   Title  He will be able to resume lateral raises without pain to 90 degrees on RT.    Time 6   Period Weeks   Status New     PT LONG TERM GOAL #3   Title He will have full active RT shoulder ROM without pain.    Time 6   Period Weeks   Status New     PT LONG TERM GOAL #4   Title He will be able to lift 20 pounds with both arms and RT arm alone 10 pounds overhead with min to no pain   Time 6   Period Weeks   Status New               Plan - 11/01/16 0934    Clinical Impression Statement Improving with less pain in general and with MMT with resistance almost normal. He was able to do all lifting with no pain except horizontal abduction and chest press with full hor abduciton. otherwise no pain.    PT Treatment/Interventions Iontophoresis 4mg /ml Dexamethasone;Ultrasound;Patient/family education;Manual techniques;Taping;Therapeutic exercise;Dry needling;Cryotherapy   PT Next Visit Plan He is able to do HEP. Ionto , STW and modalities   PT Home Exercise Plan Rhomboid stretch , rockwood, STW with tennis ball   Consulted and Agree with Plan of Care Patient      Patient will benefit  from skilled therapeutic intervention in order to improve the following deficits and impairments:  Pain, Increased muscle spasms, Impaired UE functional use, Decreased activity tolerance, Decreased strength  Visit Diagnosis: Right shoulder pain, unspecified chronicity  Muscle weakness (generalized)  Other muscle spasm  Impingement syndrome of right shoulder  Strain of right deltoid muscle, initial encounter     Problem List Patient Active Problem List   Diagnosis Date Noted  . Right thigh pain 02/18/2014  . Pes planus of both feet 02/18/2014    Jonathon Johnston  PT 11/01/2016, 10:51 AM  [facility] [address] [place], [place], [address] Phone: [phone]   Fax:  [phone]  Name: Jonathon Johnston MRN: [id number] Date of Birth: February 18, 1971

## 2016-11-07 ENCOUNTER — Ambulatory Visit: Payer: 59

## 2016-11-07 DIAGNOSIS — M62838 Other muscle spasm: Secondary | ICD-10-CM

## 2016-11-07 DIAGNOSIS — M6281 Muscle weakness (generalized): Secondary | ICD-10-CM | POA: Diagnosis not present

## 2016-11-07 DIAGNOSIS — S46811A Strain of other muscles, fascia and tendons at shoulder and upper arm level, right arm, initial encounter: Secondary | ICD-10-CM | POA: Diagnosis not present

## 2016-11-07 DIAGNOSIS — M25511 Pain in right shoulder: Secondary | ICD-10-CM | POA: Diagnosis not present

## 2016-11-07 DIAGNOSIS — M7541 Impingement syndrome of right shoulder: Secondary | ICD-10-CM | POA: Diagnosis not present

## 2016-11-07 NOTE — Therapy (Signed)
[place], [place], [address] Phone: [phone]   Fax:  [phone]  Physical Therapy Treatment  Patient Details  Name: Jonathon Johnston MRN: [id number] Date of Birth: 01/14/1971 Referring Provider: Tania Ade, MD  Encounter Date: 11/07/2016      PT End of Session - 11/07/16 0922    Visit Number 5   Number of Visits 13   Date for PT Re-Evaluation 11/25/16   PT Start Time 0923   PT Stop Time 1003   PT Time Calculation (min) 40 min   Activity Tolerance Patient tolerated treatment well;No increased pain   Behavior During Therapy [facility] for tasks assessed/performed      No past medical history on file.  No past surgical history on file.  There were no vitals filed for this visit.                       [facility] Adult PT Treatment/Exercise - 11/07/16 0001      Shoulder Exercises: ROM/Strengthening   UBE (Upper Arm Bike) L3.5  3 min forward 3 back     Shoulder Exercises: Stretch   Cross Chest Stretch 2 reps;60 seconds     Ultrasound   Ultrasound Location post lat shoulder    Ultrasound Parameters 100% 1.5 Wcm2 1MHz   Ultrasound Goals Pain     Iontophoresis   Type of Iontophoresis Dexamethasone   Location posterior RT but more distal and lateral today area of pain   Dose 1cc   Time 4-6 hours     Manual Therapy   Soft tissue mobilization STW with and without tool to posterior RT shoulder                   PT Short Term Goals - 11/07/16 1004      PT SHORT TERM GOAL #1   Title He will be independent with inital HEP   Status Achieved     PT SHORT TERM GOAL #2   Title He will be able to report pain decreased 30% or more with reaching   Status Achieved     PT SHORT TERM GOAL #3   Title he will demo understanding of good posture to decr pressure to soft tisues in shoulder   Status Achieved           PT Long Term Goals - 11/07/16 1004      PT LONG TERM  GOAL #1   Title He will be independent with all HEP   Status On-going     PT LONG TERM GOAL #2   Title He will be able to resume lateral raises without pain to 90 degrees on RT.    Status On-going     PT LONG TERM GOAL #3   Title He will have full active RT shoulder ROM without pain.    Status On-going     PT LONG TERM GOAL #4   Title He will be able to lift 20 pounds with both arms and RT arm alone 10 pounds overhead with min to no pain   Status On-going               Plan - 11/07/16 0933    Clinical Impression Statement More sore with use of RT arm this weekend cleaning for party so limited exercise today. He will ice at home. and stretch. Resume exercise next visit.    PT Treatment/Interventions Iontophoresis 4mg /ml Dexamethasone;Ultrasound;Patient/family education;Manual techniques;Taping;Therapeutic exercise;Dry  needling;Cryotherapy   PT Next Visit Plan Stretch / strength, possible taping. Ionto , STW and modalities   PT Home Exercise Plan Rhomboid stretch , rockwood, STW with tennis ball   Consulted and Agree with Plan of Care Patient      Patient will benefit from skilled therapeutic intervention in order to improve the following deficits and impairments:  Pain, Increased muscle spasms, Impaired UE functional use, Decreased activity tolerance, Decreased strength  Visit Diagnosis: Right shoulder pain, unspecified chronicity  Muscle weakness (generalized)  Other muscle spasm  Impingement syndrome of right shoulder  Strain of right deltoid muscle, initial encounter     Problem List Patient Active Problem List   Diagnosis Date Noted  . Right thigh pain 02/18/2014  . Pes planus of both feet 02/18/2014    [name]  PT 11/07/2016, 10:07 AM  [facility] [address] [place], [place], [address] Phone: [phone]   Fax:  [phone]  Name: UGO THOMA MRN: [id number] Date of Birth:  08/13/1970

## 2016-11-10 ENCOUNTER — Ambulatory Visit: Payer: 59

## 2016-11-10 DIAGNOSIS — S46811A Strain of other muscles, fascia and tendons at shoulder and upper arm level, right arm, initial encounter: Secondary | ICD-10-CM | POA: Diagnosis not present

## 2016-11-10 DIAGNOSIS — M6281 Muscle weakness (generalized): Secondary | ICD-10-CM

## 2016-11-10 DIAGNOSIS — M25511 Pain in right shoulder: Secondary | ICD-10-CM

## 2016-11-10 DIAGNOSIS — M62838 Other muscle spasm: Secondary | ICD-10-CM

## 2016-11-10 DIAGNOSIS — M7541 Impingement syndrome of right shoulder: Secondary | ICD-10-CM | POA: Diagnosis not present

## 2016-11-10 NOTE — Therapy (Signed)
[place], [place], [address] Phone: [phone]   Fax:  [phone]  Physical Therapy Treatment  Patient Details  Name: Jonathon Johnston MRN: [id number] Date of Birth: April 13, 1971 Referring Provider: Tania Ade, MD  Encounter Date: 11/10/2016      PT End of Session - 11/10/16 0933    Visit Number 6   Number of Visits 13   Date for PT Re-Evaluation 11/25/16   Authorization Type UMR   PT Start Time 0926   PT Stop Time 1013   PT Time Calculation (min) 47 min   Activity Tolerance Patient tolerated treatment well   Behavior During Therapy [facility] for tasks assessed/performed      History reviewed. No pertinent past medical history.  History reviewed. No pertinent surgical history.  There were no vitals filed for this visit.      Subjective Assessment - 11/10/16 0932    Subjective No pain today   Currently in Pain? No/denies                         [facility] Adult PT Treatment/Exercise - 11/10/16 0001      Shoulder Exercises: Prone   Extension Right;10 reps  4 pounds 3 sets   Horizontal ABduction 1 Right;10 reps   Horizontal ABduction 1 Weight (lbs) 4   Horizontal ABduction 1 Limitations 3 sets   Other Prone Exercises rows with 15 pounds x 30 reps     Shoulder Exercises: Sidelying   External Rotation Right;10 reps   External Rotation Weight (lbs) 4   External Rotation Limitations 3 sets to horizontal as has pain higher than this     Shoulder Exercises: Standing   Flexion Both;10 reps   Shoulder Flexion Weight (lbs) 4   Flexion Limitations 3 sets   ABduction Both;10 reps   Shoulder ABduction Weight (lbs) 4   ABduction Limitations 3 sets   Other Standing Exercises push ups off coutner x 10 discomfort and rep  7-8     Shoulder Exercises: ROM/Strengthening   UBE (Upper Arm Bike) L4 3 min forward 3 back     Shoulder Exercises: Stretch   Cross Chest Stretch 2 reps;30  seconds     Shoulder Exercises: Body Blade   ABduction 30 seconds   External Rotation 30 seconds     Ultrasound   Ultrasound Location post /lat RT shoulder   Ultrasound Parameters 100% !.5 Wc2, 1 MHz   Ultrasound Goals Pain     Iontophoresis   Type of Iontophoresis Dexamethasone   Location posterior RT but more distal and lateral today area of pain   Dose 1cc   Time 4-6 hours     Manual Therapy   Soft tissue mobilization STW with and without tool to posterior RT shoulder                   PT Short Term Goals - 11/07/16 1004      PT SHORT TERM GOAL #1   Title He will be independent with inital HEP   Status Achieved     PT SHORT TERM GOAL #2   Title He will be able to report pain decreased 30% or more with reaching   Status Achieved     PT SHORT TERM GOAL #3   Title he will demo understanding of good posture to decr pressure to soft tisues in shoulder   Status Achieved  PT Long Term Goals - 11/07/16 1004      PT LONG TERM GOAL #1   Title He will be independent with all HEP   Status On-going     PT LONG TERM GOAL #2   Title He will be able to resume lateral raises without pain to 90 degrees on RT.    Status On-going     PT LONG TERM GOAL #3   Title He will have full active RT shoulder ROM without pain.    Status On-going     PT LONG TERM GOAL #4   Title He will be able to lift 20 pounds with both arms and RT arm alone 10 pounds overhead with min to no pain   Status On-going               Plan - 11/10/16 1057    Clinical Impression Statement He continues to report improved ROM with less pain but still with pain onset with reaching and moing arm with rotation  and with hor abduciton . Tolerated part body weight with push up. but not painfree.  Will see x2 then pt to MD   PT Treatment/Interventions Iontophoresis 4mg /ml Dexamethasone;Ultrasound;Patient/family education;Manual techniques;Taping;Therapeutic exercise;Dry  needling;Cryotherapy   PT Next Visit Plan Stretch / strength, possible taping. Ionto , STW and modalities   PT Home Exercise Plan Rhomboid stretch , rockwood, STW with tennis ball   Consulted and Agree with Plan of Care Patient      Patient will benefit from skilled therapeutic intervention in order to improve the following deficits and impairments:  Pain, Increased muscle spasms, Impaired UE functional use, Decreased activity tolerance, Decreased strength  Visit Diagnosis: Right shoulder pain, unspecified chronicity  Muscle weakness (generalized)  Other muscle spasm  Impingement syndrome of right shoulder  Strain of right deltoid muscle, initial encounter     Problem List Patient Active Problem List   Diagnosis Date Noted  . Right thigh pain 02/18/2014  . Pes planus of both feet 02/18/2014    [name]  PT 11/10/2016, 11:00 AM  [facility] [address] [place], [place], [address] Phone: [phone]   Fax:  [phone]  Name: Jonathon Johnston MRN: [id number] Date of Birth: Feb 25, 1971

## 2016-11-14 ENCOUNTER — Ambulatory Visit: Payer: 59

## 2016-11-14 DIAGNOSIS — M25511 Pain in right shoulder: Secondary | ICD-10-CM | POA: Diagnosis not present

## 2016-11-14 DIAGNOSIS — M62838 Other muscle spasm: Secondary | ICD-10-CM

## 2016-11-14 DIAGNOSIS — M7541 Impingement syndrome of right shoulder: Secondary | ICD-10-CM

## 2016-11-14 DIAGNOSIS — S46811A Strain of other muscles, fascia and tendons at shoulder and upper arm level, right arm, initial encounter: Secondary | ICD-10-CM | POA: Diagnosis not present

## 2016-11-14 DIAGNOSIS — M6281 Muscle weakness (generalized): Secondary | ICD-10-CM

## 2016-11-14 NOTE — Therapy (Addendum)
[place], [place], [address] Phone: [phone]   Fax:  [phone]  Physical Therapy Treatment/Discharge  Patient Details  Name: Jonathon Johnston MRN: [id number] Date of Birth: Feb 11, 1971 Referring Provider: Tania Ade, MD  Encounter Date: 11/14/2016      PT End of Session - 11/14/16 0704    Visit Number 7   Number of Visits 13   Date for PT Re-Evaluation 11/25/16   Authorization Type UMR   PT Start Time 0700   PT Stop Time 0745   PT Time Calculation (min) 45 min   Activity Tolerance Patient tolerated treatment well;No increased pain   Behavior During Therapy [facility] for tasks assessed/performed      History reviewed. No pertinent past medical history.  History reviewed. No pertinent surgical history.  There were no vitals filed for this visit.      Subjective Assessment - 11/14/16 0705    Subjective No pain now ., Good over weekend.    Currently in Pain? No/denies            [facility] PT Assessment - 11/14/16 0001      Palpation   Palpation comment still tender posterior deltoid                      [facility] Adult PT Treatment/Exercise - 11/14/16 0001      Shoulder Exercises: Prone   Extension Right;10 reps  3x10 reps   Horizontal ABduction 1 Right;10 reps   Horizontal ABduction 1 Weight (lbs) 5   Horizontal ABduction 1 Limitations 3 sets   Other Prone Exercises rows with 25 pounds x 20 reps     Shoulder Exercises: Sidelying   External Rotation Right;10 reps   External Rotation Weight (lbs) 5   External Rotation Limitations 3 sets to above horizontal , better than last session, but has has pain higher than this     Shoulder Exercises: Standing   Shoulder Flexion Weight (lbs) 5   Flexion Limitations 3 sets of 10 bilateral   Shoulder ABduction Weight (lbs) 5   ABduction Limitations 3 sets of 10  bilateral     Shoulder Exercises: ROM/Strengthening   UBE (Upper Arm  Bike) L4 3 min forward 3 back   Plank 5 reps   Plank Limitations pushup position with rottion to one arm x 5 each side.      Shoulder Exercises: Stretch   Cross Chest Stretch 1 rep;60 seconds     Shoulder Exercises: Body Blade   ABduction 30 seconds;3 reps   External Rotation 30 seconds;3 reps     Ultrasound   Ultrasound Location post lat deltoid   Ultrasound Parameters 100% 1 mHz , 1.5 wcm2   Ultrasound Goals Pain     Iontophoresis   Type of Iontophoresis Dexamethasone   Location posterior RT but more distal and lateral today area of pain   Dose 1cc   Time 4-6 hours     Manual Therapy   Soft tissue mobilization STW with and without tool to posterior RT shoulder                   PT Short Term Goals - 11/07/16 1004      PT SHORT TERM GOAL #1   Title He will be independent with inital HEP   Status Achieved     PT SHORT TERM GOAL #2   Title He will be able to report pain decreased 30% or more  with reaching   Status Achieved     PT SHORT TERM GOAL #3   Title he will demo understanding of good posture to decr pressure to soft tisues in shoulder   Status Achieved           PT Long Term Goals - 11/07/16 1004      PT LONG TERM GOAL #1   Title He will be independent with all HEP   Status On-going     PT LONG TERM GOAL #2   Title He will be able to resume lateral raises without pain to 90 degrees on RT.    Status On-going     PT LONG TERM GOAL #3   Title He will have full active RT shoulder ROM without pain.    Status On-going     PT LONG TERM GOAL #4   Title He will be able to lift 20 pounds with both arms and RT arm alone 10 pounds overhead with min to no pain   Status On-going               Plan - 11/14/16 0704    Clinical Impression Statement No incr pain over week end . doing well with exercises. still with pain but less and more intermittant. tender post deltoid but harder to find the spot now.    PT Treatment/Interventions  Iontophoresis 57m/ml Dexamethasone;Ultrasound;Patient/family education;Manual techniques;Taping;Therapeutic exercise;Dry needling;Cryotherapy   PT Next Visit Plan Stretch / strength, possible taping. Ionto , STW and modalities   PT Home Exercise Plan Rhomboid stretch , rockwood, STW with tennis ball   Consulted and Agree with Plan of Care Patient      Patient will benefit from skilled therapeutic intervention in order to improve the following deficits and impairments:  Pain, Increased muscle spasms, Impaired UE functional use, Decreased activity tolerance, Decreased strength  Visit Diagnosis: Right shoulder pain, unspecified chronicity  Muscle weakness (generalized)  Other muscle spasm  Impingement syndrome of right shoulder  Strain of right deltoid muscle, initial encounter     Problem List Patient Active Problem List   Diagnosis Date Noted  . Right thigh pain 02/18/2014  . Pes planus of both feet 02/18/2014    CDarrel Hoover PT 11/14/2016, 7:48 AM  [facility] [place] [address]Phone: [phone]  Fax:  [phone] Name: Jonathon HILYARDMRN: [id number] of Birth: [birth year] PHYSICAL THERAPY DISCHARGE SUMMARY  Visits from Start of Care: 7  Current functional level related to goals / functional outcomes: After looking in his chart he is still having shoulder pain. I am not sure why he did not return for PT. We would be happy to see him if more PT appropriate   Remaining deficits: Continues with shoulder pain   Education / Equipment: HEP  Plan:                                                    Patient goals were partially met. Patient is being discharged due to not returning since the last visit.  ?????   SPearson ForsterPT 01/12/17   7:29 AM

## 2016-11-21 ENCOUNTER — Ambulatory Visit: Payer: 59

## 2016-12-20 ENCOUNTER — Encounter (HOSPITAL_COMMUNITY): Payer: Self-pay | Admitting: Nurse Practitioner

## 2016-12-20 ENCOUNTER — Emergency Department (HOSPITAL_COMMUNITY): Payer: 59

## 2016-12-20 ENCOUNTER — Emergency Department (HOSPITAL_COMMUNITY)
Admission: EM | Admit: 2016-12-20 | Discharge: 2016-12-20 | Disposition: A | Discharge status: Home / Self Care | Payer: 59 | Attending: Emergency Medicine | Admitting: Emergency Medicine

## 2016-12-20 DIAGNOSIS — X58XXXA Exposure to other specified factors, initial encounter: Secondary | ICD-10-CM | POA: Diagnosis not present

## 2016-12-20 DIAGNOSIS — Y999 Unspecified external cause status: Secondary | ICD-10-CM | POA: Diagnosis not present

## 2016-12-20 DIAGNOSIS — Y9302 Activity, running: Secondary | ICD-10-CM | POA: Insufficient documentation

## 2016-12-20 DIAGNOSIS — S76912A Strain of unspecified muscles, fascia and tendons at thigh level, left thigh, initial encounter: Secondary | ICD-10-CM | POA: Insufficient documentation

## 2016-12-20 DIAGNOSIS — Y9289 Other specified places as the place of occurrence of the external cause: Secondary | ICD-10-CM | POA: Diagnosis not present

## 2016-12-20 DIAGNOSIS — M25562 Pain in left knee: Secondary | ICD-10-CM | POA: Diagnosis not present

## 2016-12-20 DIAGNOSIS — Y998 Other external cause status: Secondary | ICD-10-CM | POA: Diagnosis not present

## 2016-12-20 DIAGNOSIS — Z79899 Other long term (current) drug therapy: Secondary | ICD-10-CM | POA: Insufficient documentation

## 2016-12-20 DIAGNOSIS — S79922A Unspecified injury of left thigh, initial encounter: Secondary | ICD-10-CM | POA: Diagnosis not present

## 2016-12-20 DIAGNOSIS — X509XXA Other and unspecified overexertion or strenuous movements or postures, initial encounter: Secondary | ICD-10-CM | POA: Diagnosis not present

## 2016-12-20 DIAGNOSIS — Y929 Unspecified place or not applicable: Secondary | ICD-10-CM | POA: Insufficient documentation

## 2016-12-20 DIAGNOSIS — M79652 Pain in left thigh: Secondary | ICD-10-CM

## 2016-12-20 DIAGNOSIS — T148XXA Other injury of unspecified body region, initial encounter: Secondary | ICD-10-CM

## 2016-12-20 HISTORY — DX: Gastro-esophageal reflux disease without esophagitis: K21.9

## 2016-12-20 MED ORDER — HYDROCODONE-ACETAMINOPHEN 5-325 MG PO TABS: ORAL_TABLET | ORAL | 0 refills | Status: DC

## 2016-12-20 MED FILL — HYDROCODON-APAP 5-325: 5-325 | 2 days supply | Qty: 12 | Fill #0

## 2016-12-20 NOTE — ED Notes (Signed)
Discontinued knee sleeve- pt will get one OTC

## 2016-12-20 NOTE — ED Provider Notes (Signed)
[place] DEPT Provider Note   CSN: [id number] Arrival date & time: 12/20/16  1036     History   Chief Complaint Chief Complaint  Patient presents with  . Leg Pain    HPI Jonathon Johnston is a 46 y.o. male.  HPI Patient reports acute pain in his distal posterior left thigh while running this morning.  His pain is moderate in severity at this time.  He tried ibuprofen and aspirin at home with some improvement.  He is concerned about possibility of a pulled/torn hamstring.  He's been able to ambulate on the left lower from a since the injury but reports that he is walking gingerly.  No numbness or tingling down in the lower extremity.  No other complaints.   Past Medical History:  Diagnosis Date  . GERD (gastroesophageal reflux disease)     Patient Active Problem List   Diagnosis Date Noted  . Right thigh pain 02/18/2014  . Pes planus of both feet 02/18/2014    Past Surgical History:  Procedure Laterality Date  . APPENDECTOMY         Home Medications    Prior to Admission medications   Medication Sig Start Date End Date Taking? Authorizing Provider  HYDROcodone-acetaminophen (NORCO/VICODIN) 5-325 MG tablet Take 1-2 tablets every 6 hours as needed for severe pain 12/20/16   Jola Schmidt, MD  ibuprofen (ADVIL,MOTRIN) 600 MG tablet Take 1 tablet (600 mg total) by mouth every 6 (six) hours as needed. 12/30/13   Carlisle Cater, PA-C  omeprazole (PRILOSEC) 20 MG capsule Take 20 mg by mouth daily.    [provider]    Family History No family history on file.  Social History Social History  Substance Use Topics  . Smoking status: Never Smoker  . Smokeless tobacco: Never Used  . Alcohol use No     Comment: occasionally      Allergies   Patient has no known allergies.   Review of Systems Review of Systems  All other systems reviewed and are negative.    Physical Exam Updated Vital Signs BP 104/76   Pulse (!) 51   Temp 97.7 F (36.5 C)  (Oral)   Resp 14   Ht 6' (1.829 m)   Wt 89.8 kg (198 lb)   SpO2 98%   BMI 26.85 kg/m   Physical Exam  Constitutional: He is oriented to person, place, and time. He appears well-developed and well-nourished.  HENT:  Head: Normocephalic.  Eyes: EOM are normal.  Neck: Normal range of motion.  Pulmonary/Chest: Effort normal.  Abdominal: He exhibits no distension.  Musculoskeletal: Normal range of motion.  Normal PT and DP pulse of the left lower extremity.  No noticeable knee effusion of the left knee.  Range of motion of the left knee with some limitation past 90 secondary to pain.  Mild tenderness of the distal left hamstring insertion without bruising or overlying skin changes  Neurological: He is alert and oriented to person, place, and time.  Psychiatric: He has a normal mood and affect.  Nursing note and vitals reviewed.    ED Treatments / Results  Labs (all labs ordered are listed, but only abnormal results are displayed) Labs Reviewed - No data to display  EKG  EKG Interpretation None       Radiology Dg Knee Complete 4 Views Left  Result Date: 12/20/2016 CLINICAL DATA:  Sudden onset of posterior left knee pain while running today. EXAM: LEFT KNEE - COMPLETE 4+ VIEW COMPARISON:  None in PACs FINDINGS: The bones of the left knee are subjectively adequately mineralized. The joint spaces are well maintained. There is no acute fracture or dislocation. No definite joint effusion is observed. No significant osteophyte formation is demonstrated. IMPRESSION: There is no acute or significant chronic bony abnormality of the left knee. Electronically Signed   By: David  Martinique M.D.   On: 12/20/2016 11:37    Procedures Procedures (including critical care time)  Medications Ordered in ED Medications - No data to display   Initial Impression / Assessment and Plan / ED Course  I have reviewed the triage vital signs and the nursing notes.  Pertinent labs & imaging results that  were available during my care of the patient were reviewed by me and considered in my medical decision making (see chart for details).     Rest elevation and ice.  Sports medicine follow-up.  Crutches and knee sleeve for comfort.  Home with anti-inflammatories and short course of hydrocodone for breakthrough pain  Final Clinical Impressions(s) / ED Diagnoses   Final diagnoses:  Acute pain of left thigh  Muscle strain    New Prescriptions Current Discharge Medication List       Jola Schmidt, MD 12/20/16 1224

## 2016-12-20 NOTE — ED Notes (Signed)
Paged ortho tech 

## 2016-12-20 NOTE — ED Triage Notes (Signed)
Patient was running- training for race and was running down hill and had a sharp pain in left thigh and knee. Patient kept running for a little while but then was unable to keep running due to pain. Patient took ASA, ibuprofen, and iced knee without relief of symptoms. Very painful with flexion and weight bearing.

## 2016-12-20 NOTE — ED Notes (Signed)
Pt returned from xray

## 2016-12-20 NOTE — ED Notes (Signed)
Pt to xray

## 2016-12-21 ENCOUNTER — Encounter: Payer: Self-pay | Admitting: Family Medicine

## 2016-12-21 ENCOUNTER — Ambulatory Visit (INDEPENDENT_AMBULATORY_CARE_PROVIDER_SITE_OTHER): Payer: 59 | Admitting: Family Medicine

## 2016-12-21 DIAGNOSIS — G8929 Other chronic pain: Secondary | ICD-10-CM

## 2016-12-21 DIAGNOSIS — M25511 Pain in right shoulder: Secondary | ICD-10-CM | POA: Diagnosis not present

## 2016-12-21 DIAGNOSIS — S76312A Strain of muscle, fascia and tendon of the posterior muscle group at thigh level, left thigh, initial encounter: Secondary | ICD-10-CM

## 2016-12-21 DIAGNOSIS — S76302A Unspecified injury of muscle, fascia and tendon of the posterior muscle group at thigh level, left thigh, initial encounter: Secondary | ICD-10-CM

## 2016-12-21 MED ORDER — NITROGLYCERIN 0.2 MG/HR TD PT24: MEDICATED_PATCH | TRANSDERMAL | 1 refills | Status: DC

## 2016-12-21 MED FILL — NITROGLYCERIN 0.2 MG/HR PTC: 0.2 | 90 days supply | Qty: 22 | Fill #0

## 2016-12-21 NOTE — Patient Instructions (Addendum)
You have a hamstring strain (low grade 2). I expect this to take 3-4 weeks to resolve. Wear compression sleeve when up and walking around for next 6 weeks if tolerated. Aleve 2 tabs twice a day with food OR ibuprofen 600mg  three times a day with food. Ice 15 minutes at a time at least 3-4 times a day for 2 more days then ice or heat, whichever feels better. Focus on motion exercises for next few days. As pain improves start leg curls, hamstring swings and finally running lunges - add 2 pound weight with time if these are too easy. 3 sets of 10 once or twice a day. Consider physical therapy as well. Over next few days to weeks as pain improves ok to swim, cycle with low resistance if not painful. Follow up with me in 1-2 weeks for reevaluation.  You have rotator cuff impingement of your right shoulder. Try to avoid painful activities (overhead activities, lifting with extended arm) as much as possible. Aleve 2 tabs twice a day with food OR ibuprofen 3 tabs three times a day with food for pain and inflammation. Can take tylenol in addition to this. Do home exercise program with theraband and scapular stabilization exercises daily 3 sets of 10 once a day. Nitro patches 1/4th patch to affected shoulder, change daily. Follow up with me in 6 weeks for this typically.

## 2016-12-23 DIAGNOSIS — S76312A Strain of muscle, fascia and tendon of the posterior muscle group at thigh level, left thigh, initial encounter: Secondary | ICD-10-CM | POA: Insufficient documentation

## 2016-12-23 DIAGNOSIS — M25511 Pain in right shoulder: Secondary | ICD-10-CM | POA: Insufficient documentation

## 2016-12-23 NOTE — Assessment & Plan Note (Signed)
consistent with Grade 2 strain.  Compression sleeve.  Aleve or ibuprofen, icing for 2 more days then ice or heat.  Motion exercises.  Crutches if needed.  Reviewed how to advance home exercise program.  Cross training if not painful but no running.  F/u in 1-2 weeks.

## 2016-12-23 NOTE — Progress Notes (Signed)
PCP: Marda Stalker, PA-C  Subjective:   HPI: Patient is a 46 y.o. male here for left leg injury.  Patient reports yesterday he went for a 7.5 mile run. Felt some medial pain just above left knee less than halfway into this run. Was on a downhill when this started. Progressed and worked its way proximal. Severity caused him to have to walk the last 3 miles. Using icing, ibuprofen, tylenol. No prior injuries here. No compression used. Pain is sharp, bothers with walking also. No bruising or swelling. No skin changes, numbness. He also reported some pain lateral right shoulder, worse with reaching and overhead motions. Has done physical therapy, had injection without benefit.  Past Medical History:  Diagnosis Date  . GERD (gastroesophageal reflux disease)     Current Outpatient Prescriptions on File Prior to Visit  Medication Sig Dispense Refill  . HYDROcodone-acetaminophen (NORCO/VICODIN) 5-325 MG tablet Take 1-2 tablets every 6 hours as needed for severe pain 12 tablet 0  . ibuprofen (ADVIL,MOTRIN) 600 MG tablet Take 1 tablet (600 mg total) by mouth every 6 (six) hours as needed. 20 tablet 0  . omeprazole (PRILOSEC) 20 MG capsule Take 20 mg by mouth daily.     No current facility-administered medications on file prior to visit.     Past Surgical History:  Procedure Laterality Date  . APPENDECTOMY      No Known Allergies  Social History   Social History  . Marital status: Married    Spouse name: N/A  . Number of children: N/A  . Years of education: N/A   Occupational History  . Not on file.   Social History Main Topics  . Smoking status: Never Smoker  . Smokeless tobacco: Never Used  . Alcohol use No     Comment: occasionally   . Drug use: No  . Sexual activity: Not on file   Other Topics Concern  . Not on file   Social History Narrative  . No narrative on file    No family history on file.  BP 112/75   Ht 6' (1.829 m)   Wt 198 lb (89.8 kg)    BMI 26.85 kg/m   Review of Systems: See HPI above.     Objective:  Physical Exam:  Gen: NAD, comfortable in exam room  Left leg: No gross deformity, swelling, bruising. TTP medial distal hamstring but not within tendon.  No palpable defect. FROM knee but pain and 3/5 strength with knee flexion. 5/5 with knee extension. Sensation intact to light touch. Negative ant/post drawers. Negative valgus/varus testing. Negative lachmanns. Negative mcmurrays, apleys, patellar apprehension. NV intact distally.  Right leg: FROM knee without pain.  Right shoulder: No swelling, ecchymoses.  No gross deformity. No TTP. FROM with painful arc. Positive Hawkins, Neers. Negative Yergasons. Strength 5/5 with empty can and resisted internal/external rotation.  Pain empty can. Negative apprehension. NV intact distally.  MSK u/s left leg:  Partial tear noted within distal medial hamstring.  Tendon intact.  No other abnormalities.   Assessment & Plan:  1. Left hamstring strain - consistent with Grade 2 strain.  Compression sleeve.  Aleve or ibuprofen, icing for 2 more days then ice or heat.  Motion exercises.  Crutches if needed.  Reviewed how to advance home exercise program.  Cross training if not painful but no running.  F/u in 1-2 weeks.  2. Right shoulder pain - 2/2 impingement.  Shown home exercises to do daily.  Aleve or ibuprofen.  He would like  to start nitro patches - discussed risks of headache, skin irritation.

## 2016-12-23 NOTE — Assessment & Plan Note (Signed)
2/2 impingement.  Shown home exercises to do daily.  Aleve or ibuprofen.  He would like to start nitro patches - discussed risks of headache, skin irritation.

## 2017-01-20 MED FILL — OMEPRAZOLE 20 MG CAP: 20 | 90 days supply | Qty: 90 | Fill #0

## 2017-02-01 ENCOUNTER — Ambulatory Visit: Payer: 59 | Admitting: Family Medicine

## 2017-05-04 DIAGNOSIS — Z79899 Other long term (current) drug therapy: Secondary | ICD-10-CM | POA: Diagnosis not present

## 2017-05-04 DIAGNOSIS — F331 Major depressive disorder, recurrent, moderate: Secondary | ICD-10-CM | POA: Diagnosis not present

## 2017-05-04 MED FILL — BUPROPION HCL XL 150 MG TAB: 150 | 30 days supply | Qty: 30 | Fill #0

## 2017-05-10 MED FILL — OMEPRAZOLE 20 MG CAP: 20 | 90 days supply | Qty: 90 | Fill #0

## 2017-06-05 MED FILL — BUPROPION HCL XL 150 MG TAB: 150 | 30 days supply | Qty: 30 | Fill #1

## 2017-06-15 DIAGNOSIS — F3341 Major depressive disorder, recurrent, in partial remission: Secondary | ICD-10-CM | POA: Diagnosis not present

## 2017-06-26 MED FILL — BUPROPION HCL XL 300 MG TAB: 300 | 30 days supply | Qty: 30 | Fill #0

## 2017-07-06 DIAGNOSIS — K219 Gastro-esophageal reflux disease without esophagitis: Secondary | ICD-10-CM | POA: Diagnosis not present

## 2017-07-24 MED FILL — BUPROPION HCL XL 300 MG TAB: 300 | 30 days supply | Qty: 30 | Fill #1

## 2017-08-24 MED FILL — BUPROPION HCL XL 300 MG TAB: 300 | 30 days supply | Qty: 30 | Fill #2

## 2017-09-19 MED FILL — OMEPRAZOLE 20 MG CAP: 20 | 90 days supply | Qty: 90 | Fill #0

## 2017-09-21 DIAGNOSIS — K219 Gastro-esophageal reflux disease without esophagitis: Secondary | ICD-10-CM | POA: Diagnosis not present

## 2017-09-21 DIAGNOSIS — F3341 Major depressive disorder, recurrent, in partial remission: Secondary | ICD-10-CM | POA: Diagnosis not present

## 2017-09-21 DIAGNOSIS — Z0000 Encounter for general adult medical examination without abnormal findings: Secondary | ICD-10-CM | POA: Diagnosis not present

## 2017-09-25 MED FILL — BUPROPION HCL XL 300 MG TAB: 300 | 30 days supply | Qty: 30 | Fill #3

## 2017-10-26 MED FILL — BUPROPION HCL XL 300 MG TAB: 300 | 30 days supply | Qty: 30 | Fill #0

## 2017-11-06 DIAGNOSIS — F3342 Major depressive disorder, recurrent, in full remission: Secondary | ICD-10-CM | POA: Diagnosis not present

## 2017-11-28 MED FILL — BUPROPION HCL XL 300 MG TAB: 300 | 30 days supply | Qty: 30 | Fill #1

## 2017-12-28 MED FILL — BUPROPION HCL XL 300 MG TAB: 300 | 30 days supply | Qty: 30 | Fill #2

## 2018-01-15 MED FILL — OMEPRAZOLE 20 MG CAP: 20 | 90 days supply | Qty: 90 | Fill #1

## 2018-01-30 MED FILL — BuPROPion HCL ER (XL) 300 M: 300 | 90 days supply | Qty: 90 | Fill #0

## 2018-04-30 MED FILL — OMEPRAZOLE 20 MG CPDR: 20 | 90 days supply | Qty: 90 | Fill #2

## 2018-04-30 MED FILL — buPROPion HCL ER (XL) 300 M: 300 | 90 days supply | Qty: 90 | Fill #1

## 2018-05-02 DIAGNOSIS — F3342 Major depressive disorder, recurrent, in full remission: Secondary | ICD-10-CM | POA: Diagnosis not present

## 2018-08-02 MED FILL — buPROPion HCL ER (XL) 300 M: 300 | 30 days supply | Qty: 30 | Fill #3

## 2018-08-09 MED FILL — OMEPRAZOLE 20 MG CPDR: 20 | 30 days supply | Qty: 30 | Fill #0

## 2018-08-27 MED FILL — buPROPion HCL ER (XL) 300 M: 300 | 90 days supply | Qty: 90 | Fill #0

## 2018-09-07 ENCOUNTER — Other Ambulatory Visit: Payer: Self-pay

## 2018-09-07 ENCOUNTER — Ambulatory Visit (INDEPENDENT_AMBULATORY_CARE_PROVIDER_SITE_OTHER): Payer: 59 | Admitting: Family Medicine

## 2018-09-07 NOTE — Patient Instructions (Signed)
Health Maintenance Due  Topic Date Due  . HIV Screening  03/01/1984  . TETANUS/TDAP  03/01/1988    Depression screen PHQ 2/9 02/18/2014  Decreased Interest 0  Down, Depressed, Hopeless 0  PHQ - 2 Score 0

## 2018-09-07 NOTE — Progress Notes (Signed)
LOS- no charge   Encounter was opened in error- I was contacted to schedule new patient visit based off a patient phone number- I opened encounter based off of phone number but patient had transferred jobs so #/name of person given Jonathon Johnston) was not the intended person for visit.

## 2018-10-29 DIAGNOSIS — F3342 Major depressive disorder, recurrent, in full remission: Secondary | ICD-10-CM | POA: Diagnosis not present

## 2018-10-29 MED FILL — TEMAZEPAM 15 MG CAPSULE: 15 | 30 days supply | Qty: 30 | Fill #0

## 2018-11-28 MED FILL — buPROPion HCL ER (XL) 300 M: 300 | 90 days supply | Qty: 90 | Fill #1

## 2018-12-12 DIAGNOSIS — F3341 Major depressive disorder, recurrent, in partial remission: Secondary | ICD-10-CM | POA: Diagnosis not present

## 2018-12-12 DIAGNOSIS — K219 Gastro-esophageal reflux disease without esophagitis: Secondary | ICD-10-CM | POA: Diagnosis not present

## 2018-12-12 MED FILL — OMEPRAZOLE 20 MG CPDR: 20 | 90 days supply | Qty: 90 | Fill #0

## 2018-12-21 MED FILL — TEMAZEPAM 15 MG CAPSULE: 15 | 30 days supply | Qty: 30 | Fill #0

## 2019-02-10 IMAGING — MR MR SHOULDER*R* W/O CM
4 of 5 series · 19 of 40 positions shown · non-contrast
Comparison: None.

CLINICAL DATA: Right shoulder pain over the last 8 weeks.

EXAM:
MRI OF THE RIGHT SHOULDER WITHOUT CONTRAST
TECHNIQUE: Multiplanar, multisequence MR imaging of the shoulder was performed.
No intravenous contrast was administered.

[Series 3: T2 fat-sat · axial · 4.0mm · 0.23mm/px · z∈[-79,+8]mm · 5 of 22 slices shown (1 of 3)]
[im 1/22]
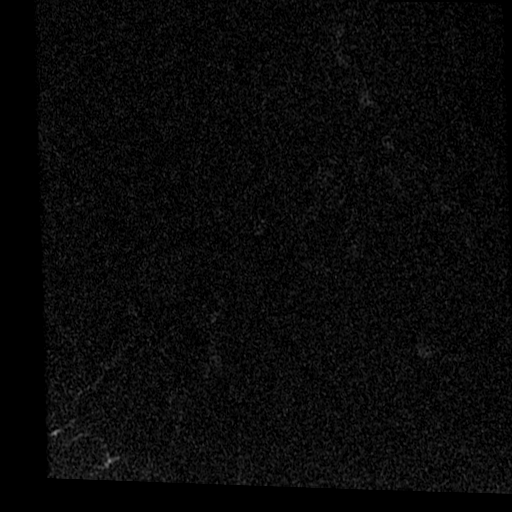
[im 4/22]
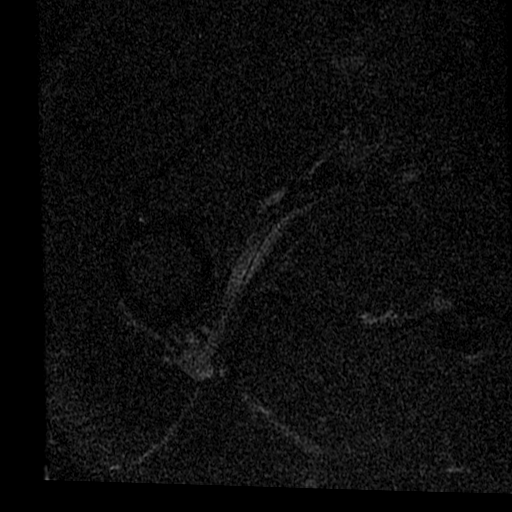
[im 7/22]
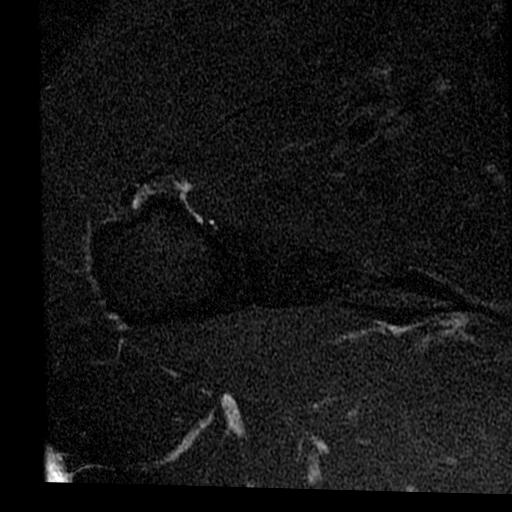
[im 13/22]
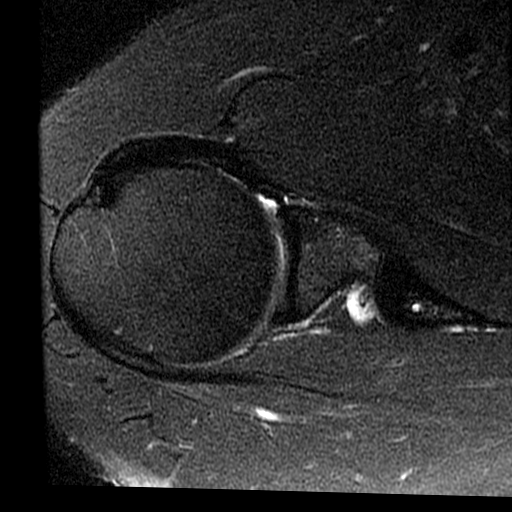
[im 19/22]
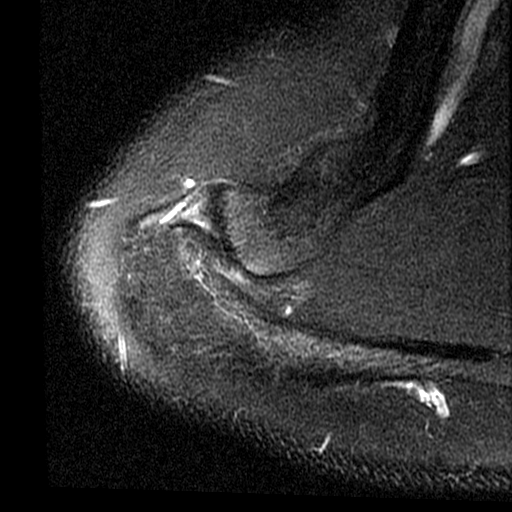

[Series 4: T2 fat-sat · oblique · 4.0mm · 0.29mm/px · 3 of 20 slices shown (2 of 3)]
[im 3/20]
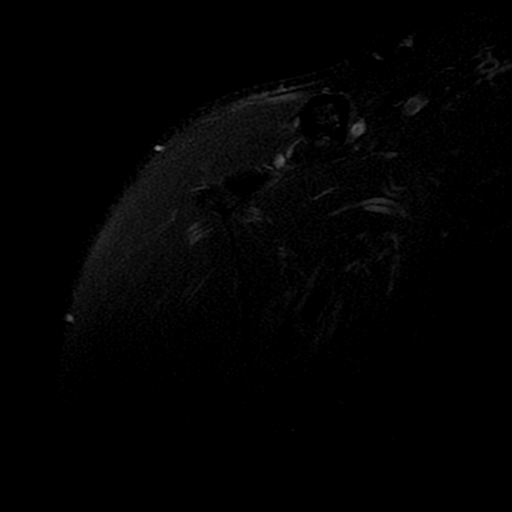
[im 11/20]
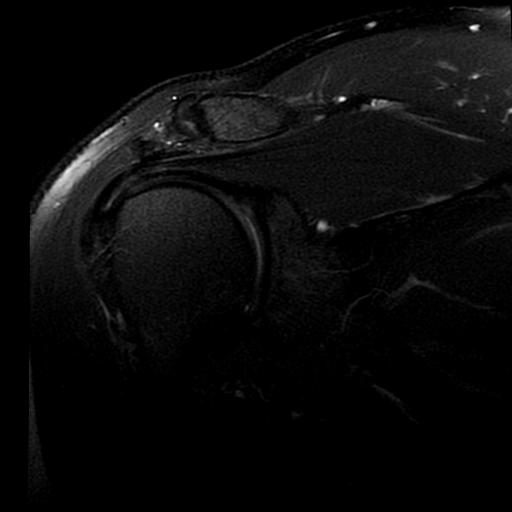
[im 17/20]
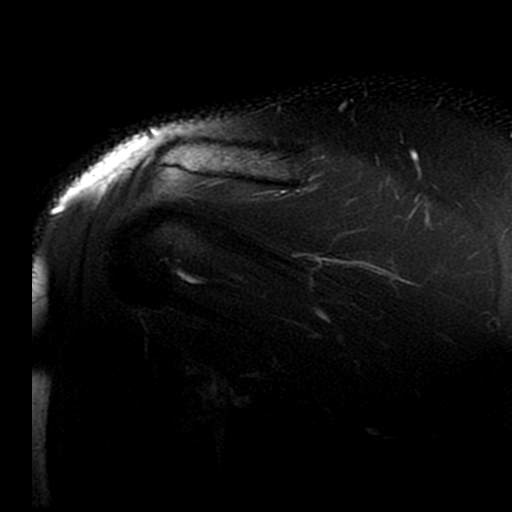

[Series 5: PD · oblique · 4.0mm · 0.29mm/px · 8 of 20 slices shown]
[im 1/20]
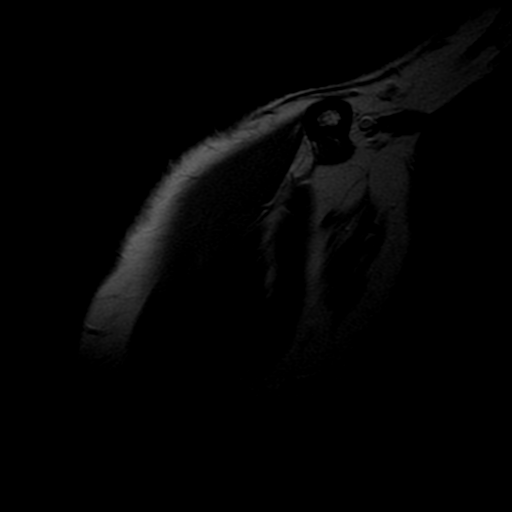
[im 3/20]
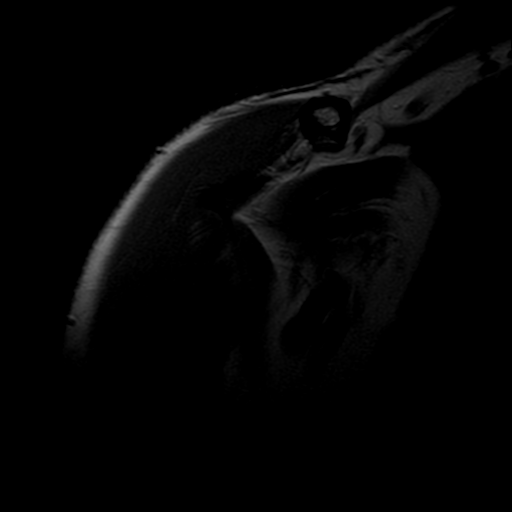
[im 6/20]
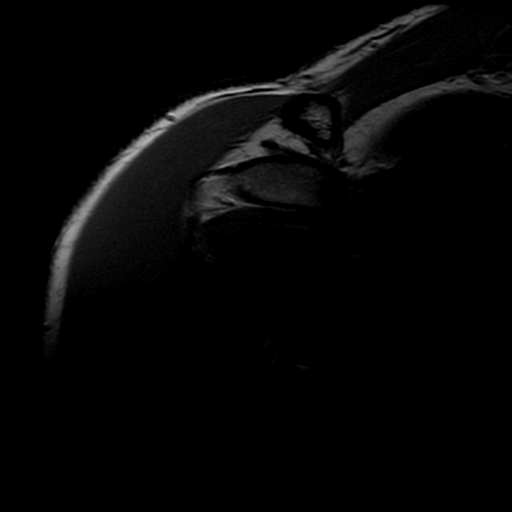
[im 9/20]
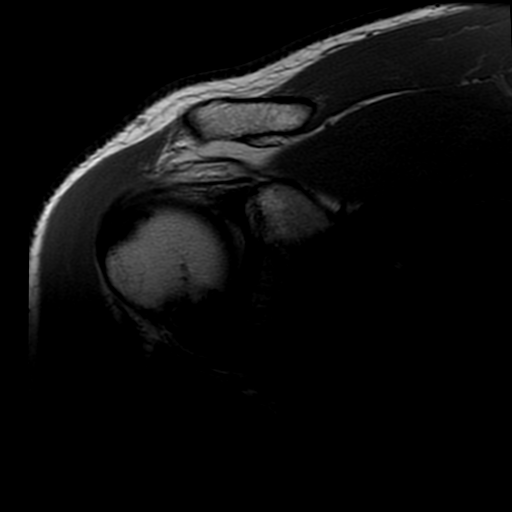
[im 11/20]
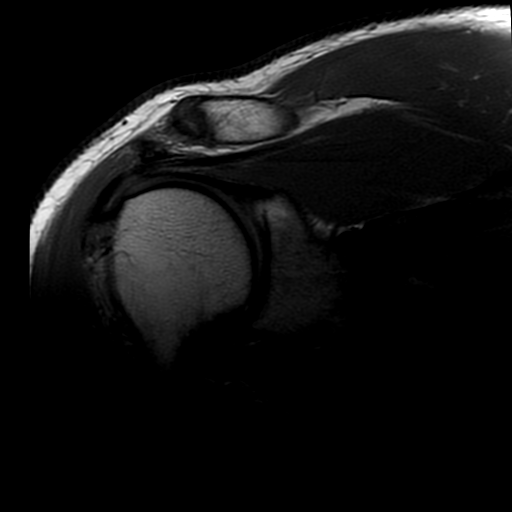
[im 14/20]
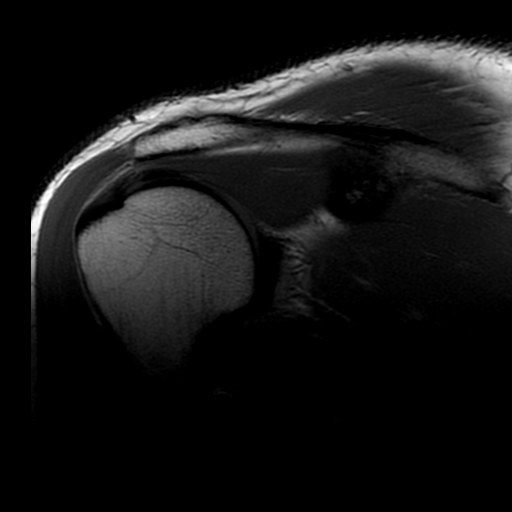
[im 17/20]
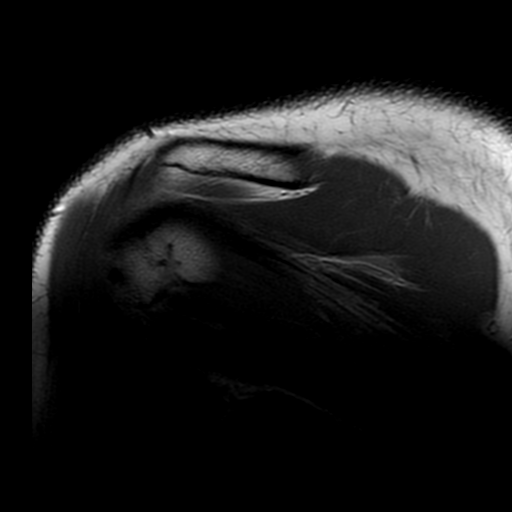
[im 20/20]
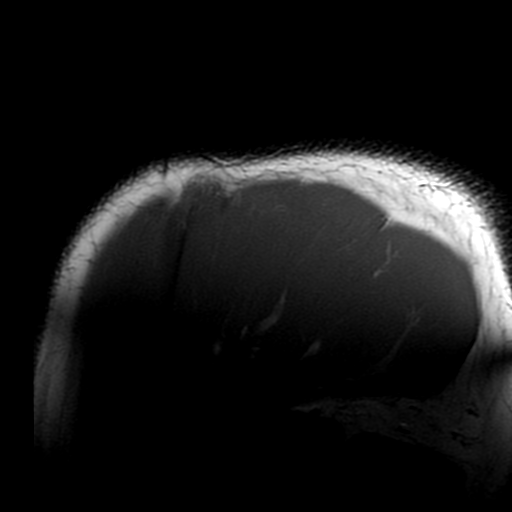

[Series 7: T2 fat-sat · oblique · 4.0mm · 0.29mm/px · 3 of 22 slices shown (3 of 3)]
[im 4/22]
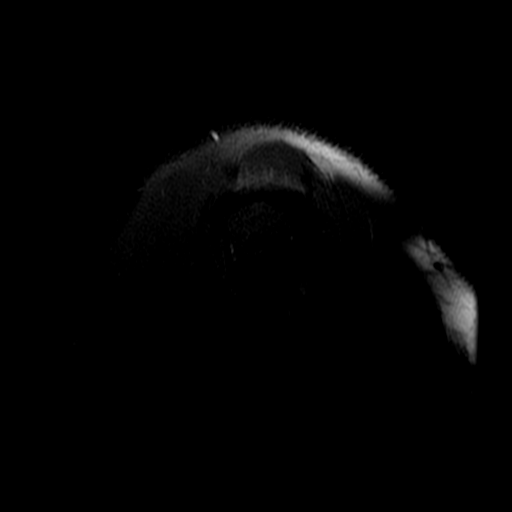
[im 13/22]
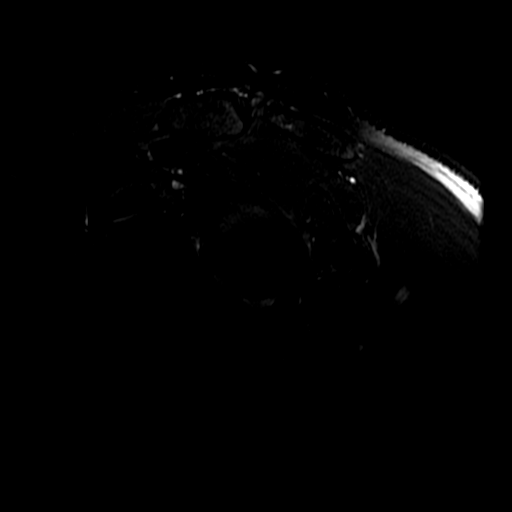
[im 19/22]
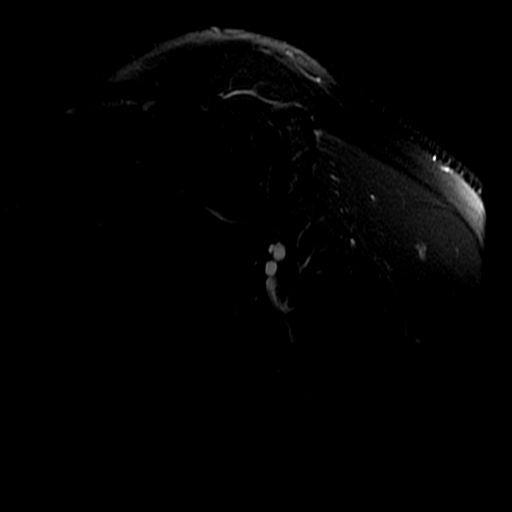

[19 of 40 positions shown; findings below may reference images not displayed]

FINDINGS: Rotator cuff:  Mild supraspinatus tendinopathy.

Muscles: Subtle edema signal along the clavicular attachment of the
deltoid anteriorly on image [DATE].

Biceps long head:  Unremarkable

Acromioclavicular Joint: Minimal spurring of the acromion along the
AC joint with subtle subcortical marrow edema. Type II acromion. No
significant fluid in the subacromial subdeltoid bursa. Mildly
laterally downsloping acromion.

Glenohumeral Joint: Unremarkable

Labrum:  Grossly unremarkable

Bones: No significant extra-articular osseous abnormalities
identified.

Other: No supplemental non-categorized findings.
IMPRESSION: 1. Possible proximal deltoid muscle sprain along the clavicular
attachment, image [DATE].
2. Mild supraspinatus tendinopathy.
3. Minimal AC joint arthropathy. Mildly laterally downsloping
acromion may predispose to impingement.

## 2020-05-25 DIAGNOSIS — Z79891 Long term (current) use of opiate analgesic: Secondary | ICD-10-CM | POA: Diagnosis not present

## 2021-02-08 ENCOUNTER — Encounter (HOSPITAL_BASED_OUTPATIENT_CLINIC_OR_DEPARTMENT_OTHER): Payer: Self-pay

## 2021-07-01 ENCOUNTER — Emergency Department
Admission: EM | Admit: 2021-07-01 | Discharge: 2021-07-01 | Disposition: A | Discharge status: Home / Self Care | Payer: 59 | Attending: Emergency Medicine | Admitting: Emergency Medicine

## 2021-07-01 ENCOUNTER — Emergency Department: Payer: 59

## 2021-07-01 ENCOUNTER — Encounter: Payer: Self-pay | Admitting: Emergency Medicine

## 2021-07-01 ENCOUNTER — Other Ambulatory Visit: Payer: Self-pay

## 2021-07-01 DIAGNOSIS — E871 Hypo-osmolality and hyponatremia: Secondary | ICD-10-CM | POA: Insufficient documentation

## 2021-07-01 DIAGNOSIS — R944 Abnormal results of kidney function studies: Secondary | ICD-10-CM | POA: Diagnosis not present

## 2021-07-01 DIAGNOSIS — R0781 Pleurodynia: Secondary | ICD-10-CM | POA: Insufficient documentation

## 2021-07-01 DIAGNOSIS — Z20822 Contact with and (suspected) exposure to covid-19: Secondary | ICD-10-CM | POA: Diagnosis not present

## 2021-07-01 DIAGNOSIS — R079 Chest pain, unspecified: Secondary | ICD-10-CM | POA: Diagnosis not present

## 2021-07-01 DIAGNOSIS — R0602 Shortness of breath: Secondary | ICD-10-CM | POA: Insufficient documentation

## 2021-07-01 DIAGNOSIS — R059 Cough, unspecified: Secondary | ICD-10-CM | POA: Diagnosis not present

## 2021-07-01 LAB — BASIC METABOLIC PANEL
Anion gap: 8 (ref 5–15)
BUN: 10 mg/dL (ref 6–20)
CO2: 25 mmol/L (ref 22–32)
Calcium: 9.1 mg/dL (ref 8.9–10.3)
Chloride: 97 mmol/L — ABNORMAL LOW (ref 98–111)
Creatinine, Ser: 1.43 mg/dL — ABNORMAL HIGH (ref 0.61–1.24)
GFR, Estimated: 59 mL/min — ABNORMAL LOW (ref >60)
Glucose, Bld: 117 mg/dL — ABNORMAL HIGH (ref 70–99)
Potassium: 3.8 mmol/L (ref 3.5–5.1)
Sodium: 130 mmol/L — ABNORMAL LOW (ref 135–145)

## 2021-07-01 LAB — CBC
HCT: 41.3 % (ref 39.0–52.0)
Hemoglobin: 14 g/dL (ref 13.0–17.0)
MCH: 29.9 pg (ref 26.0–34.0)
MCHC: 33.9 g/dL (ref 30.0–36.0)
MCV: 88.1 fL (ref 80.0–100.0)
Platelets: 319 10*3/uL (ref 150–400)
RBC: 4.69 MIL/uL (ref 4.22–5.81)
RDW: 12.8 % (ref 11.5–15.5)
WBC: 8.1 10*3/uL (ref 4.0–10.5)
nRBC: 0 % (ref 0.0–0.2)

## 2021-07-01 LAB — RESP PANEL BY RT-PCR (FLU A&B, COVID) ARPGX2
Influenza A by PCR: NEGATIVE
Influenza B by PCR: NEGATIVE
SARS Coronavirus 2 by RT PCR: NEGATIVE

## 2021-07-01 LAB — TROPONIN I (HIGH SENSITIVITY)
Troponin I (High Sensitivity): 5 ng/L (ref <18)
Troponin I (High Sensitivity): 5 ng/L (ref <18)

## 2021-07-01 MED ORDER — IOHEXOL 350 MG/ML SOLN
100.0000 mL | Freq: Once | INTRAVENOUS | Status: AC | PRN
  Administered 2021-07-01: 100 mL via INTRAVENOUS
  Filled 2021-07-01: qty 100

## 2021-07-01 MED ORDER — PREDNISONE 20 MG PO TABS
60.0000 mg | ORAL_TABLET | Freq: Once | ORAL | Status: AC
  Administered 2021-07-01: 60 mg via ORAL
  Filled 2021-07-01: qty 3

## 2021-07-01 MED ORDER — SODIUM CHLORIDE 0.9 % IV BOLUS
1000.0000 mL | Freq: Once | INTRAVENOUS | Status: AC
  Administered 2021-07-01: 1000 mL via INTRAVENOUS

## 2021-07-01 MED ORDER — PREDNISONE 10 MG (21) PO TBPK: ORAL_TABLET | ORAL | 0 refills | Status: DC

## 2021-07-01 MED ORDER — AZITHROMYCIN 250 MG PO TABS: ORAL_TABLET | ORAL | 0 refills | Status: AC

## 2021-07-01 NOTE — ED Provider Notes (Signed)
[facility] Provider Note  Patient Contact: 7:04 PM (approximate)   History   Cough and Chest Pain   HPI  Jonathon Johnston is a 51 y.o. male presents to the emergency department with pleuritic chest pain and shortness of breath.  Patient states that he developed what seemed to be viral URI-like symptoms this weekend.  He states that the viral URI-like symptoms have seemingly resolved but new exertional shortness of breath is developed.  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  He denies cough productive for purulent sputum production.  Denies a history of DVT or PE.  Denies daily smoking.  No recent surgeries.      Physical Exam   Triage Vital Signs: ED Triage Vitals  Enc Vitals Group     BP 07/01/21 1629 (!) 116/97     Pulse Rate 07/01/21 1629 97     Resp 07/01/21 1629 20     Temp 07/01/21 1629 99.5 F (37.5 C)     Temp Source 07/01/21 1629 Oral     SpO2 07/01/21 1629 99 %     Weight 07/01/21 1626 197 lb 15.6 oz (89.8 kg)     Height 07/01/21 1626 6' (1.829 m)     Head Circumference --      Peak Flow --      Pain Score 07/01/21 1626 2     Pain Loc --      Pain Edu? --      Excl. in [place]? --     Most recent vital signs: Vitals:   07/01/21 1830 07/01/21 1932  BP: 116/78 121/85  Pulse:  81  Resp:  18  Temp:    SpO2:  98%     General: Alert and in no acute distress. Eyes:  PERRL. EOMI. Head: No acute traumatic findings ENT:      Ears:       Nose: No congestion/rhinnorhea.      Mouth/Throat: Mucous membranes are moist. Neck: No stridor. No cervical spine tenderness to palpation. Cardiovascular:  Good peripheral perfusion Respiratory: Normal respiratory effort without tachypnea or retractions. Lungs CTAB. Good air entry to the bases with no decreased or absent breath sounds. Gastrointestinal: Bowel sounds 4 quadrants. Soft and nontender to palpation. No guarding or  rigidity. No palpable masses. No distention. No CVA tenderness. Musculoskeletal: Full range of motion to all extremities.  Neurologic:  No gross focal neurologic deficits are appreciated.  Skin:   No rash noted Other:   ED Results / Procedures / Treatments   Labs (all labs ordered are listed, but only abnormal results are displayed) Labs Reviewed  BASIC METABOLIC PANEL - Abnormal; Notable for the following components:      Result Value   Sodium 130 (*)    Chloride 97 (*)    Glucose, Bld 117 (*)    Creatinine, Ser 1.43 (*)    GFR, Estimated 59 (*)    All other components within normal limits  RESP PANEL BY RT-PCR (FLU A&B, COVID) ARPGX2  CBC  TROPONIN I (HIGH SENSITIVITY)  TROPONIN I (HIGH SENSITIVITY)     EKG  Normal sinus rhythm with incomplete right bundle branch block   RADIOLOGY  I personally viewed and evaluated these images as part of my medical decision making, as well as reviewing the written report by the radiologist.  ED Provider Interpretation:  I personally reviewed chest x-ray and there are no  consolidations, opacities, infiltrates or signs of large pleural effusion.  Personally reviewed CTA and there was no evidence of PE.  PROCEDURES:  Critical Care performed: No  Procedures   MEDICATIONS ORDERED IN ED: Medications  predniSONE (DELTASONE) tablet 60 mg (has no administration in time range)  sodium chloride 0.9 % bolus 1,000 mL (1,000 mLs Intravenous New Bag/Given 07/01/21 1915)  iohexol (OMNIPAQUE) 350 MG/ML injection 100 mL (100 mLs Intravenous Contrast Given 07/01/21 1947)     IMPRESSION / MDM / ASSESSMENT AND PLAN / ED COURSE  I reviewed the triage vital signs and the nursing notes.                              Differential diagnosis includes, but is not limited to, pleurisy, bronchitis, community-acquired pneumonia, STEMI, PE...  Assessment and plan SOB:  51 year old male presents to the emergency department with pleuritic chest pain and  exertional shortness of breath for the past week.  Vital signs were reassuring at triage.  On physical exam, patient was alert, active and nontoxic-appearing with mild breathlessness on exam.  Patient had normal white blood cell count on CBC.  Patient had mild hyponatremia on BMP and mildly elevated creatinine at 1.43.  Discussed the pros and cons of obtaining a D-dimer versus immediate CTA and patient elected to have CTA initially to rule out PE.  Sets of troponin within reference range.  EKG indicated normal sinus rhythm.  COVID-19 and influenza negative.   No PE on CTA of the chest or other acute abnormality.  Patient was discharged with prednisone and azithromycin.  He was given a liter of normal saline while in the emergency department.  He was given his first dose of prednisone while in the emergency department.   FINAL CLINICAL IMPRESSION(S) / ED DIAGNOSES   Final diagnoses:  Shortness of breath     Rx / DC Orders   ED Discharge Orders          Ordered    predniSONE (STERAPRED UNI-PAK 21 TAB) 10 MG (21) TBPK tablet        07/01/21 2041    azithromycin (ZITHROMAX Z-PAK) 250 MG tablet        07/01/21 2041             Note:  This document was prepared using Dragon voice recognition software and may include unintentional dictation errors.   Karren Cobble 07/01/21 2046    Carrie Mew, MD 07/02/21 Vernelle Emerald

## 2021-07-01 NOTE — ED Triage Notes (Signed)
Pt comes into the ED via POV c/o cough and congestion that has progressed to a constant burning in the chest and new SHOB with exertion.  Denies any cardiac history.  Pt does present labored with exertion while moving in triage.

## 2021-07-01 NOTE — ED Notes (Signed)
See triage note. Pt denies fever but reports having to "use several blankets last night to get comfortable due to chills"; reports "headcold", HA, SOB upon walking. Reports "burning" in chest when he coughs; cough is non-productive per pt. Pt sitting calmly on stretcher; skin dry; resp reg/unlabored; dry cough present; pt steady when walking to room.

## 2021-07-01 NOTE — Discharge Instructions (Signed)
Take tapered steroid and prednisone as directed.

## 2021-10-02 ENCOUNTER — Ambulatory Visit
Admission: EM | Admit: 2021-10-02 | Discharge: 2021-10-02 | Disposition: A | Discharge status: Home / Self Care | Payer: 59 | Attending: Emergency Medicine | Admitting: Emergency Medicine

## 2021-10-02 DIAGNOSIS — B349 Viral infection, unspecified: Secondary | ICD-10-CM

## 2021-10-02 LAB — POCT INFLUENZA A/B
Influenza A, POC: NEGATIVE
Influenza B, POC: NEGATIVE

## 2021-10-02 NOTE — Discharge Instructions (Addendum)
The flu test is negative. ? ?Take Tylenol or ibuprofen as needed for fever or discomfort.  Follow up with your primary care provider if your symptoms are not improving.   ? ?

## 2021-10-02 NOTE — ED Provider Notes (Signed)
?[facility] ? ? ? ?CSN: [id number] ?Arrival date & time: 10/02/21  1348 ? ? ?  ? ?History   ?Chief Complaint ?Chief Complaint  ?Patient presents with  ? Fever  ? Generalized Body Aches  ? ? ?HPI ?Jonathon Johnston is a 51 y.o. male.  Patient presents with fever, chills, body aches, headache x4 days.  He was seen at another urgent care yesterday and tested negative for strep and COVID.  He has been treating his symptoms with TheraFlu.  He states he had sweating and chills throughout the night last night.  He denies rash, earache, sore throat, cough, shortness of breath, vomiting, diarrhea, or other symptoms.  His medical history includes GERD. ? ?The history is provided by the patient and medical records.  ? ?Past Medical History:  ?Diagnosis Date  ? GERD (gastroesophageal reflux disease)   ? ? ?Patient Active Problem List  ? Diagnosis Date Noted  ? Left hamstring muscle strain 12/23/2016  ? Right shoulder pain 12/23/2016  ? Right thigh pain 02/18/2014  ? Pes planus of both feet 02/18/2014  ? ? ?Past Surgical History:  ?Procedure Laterality Date  ? APPENDECTOMY    ? ? ? ? ? ?Home Medications   ? ?Prior to Admission medications   ?Medication Sig Start Date End Date Taking? Authorizing Provider  ?HYDROcodone-acetaminophen (NORCO/VICODIN) 5-325 MG tablet Take 1-2 tablets every 6 hours as needed for severe pain 12/20/16   Jola Schmidt, MD  ?ibuprofen (ADVIL,MOTRIN) 600 MG tablet Take 1 tablet (600 mg total) by mouth every 6 (six) hours as needed. 12/30/13   Carlisle Cater, PA-C  ?nitroGLYCERIN (NITRODUR - DOSED IN MG/24 HR) 0.2 mg/hr patch Apply 1/4th patch to affected hamstring, change daily. 12/21/16   Dene Gentry, MD  ?omeprazole (PRILOSEC) 20 MG capsule Take 20 mg by mouth daily.    [provider]  ?predniSONE (STERAPRED UNI-PAK 21 TAB) 10 MG (21) TBPK tablet Take 6 tablets the first day, take 5 tablets the second day, take 4 tablets the third day, take 3 tablets the fourth day, take 2 tablets the  fifth day, take 1 tablet the sixth day. 07/01/21   Lannie Fields, PA-C  ? ? ?Family History ?No family history on file. ? ?Social History ?Social History  ? ?Tobacco Use  ? Smoking status: Never  ? Smokeless tobacco: Never  ?Substance Use Topics  ? Alcohol use: No  ?  Comment: occasionally   ? Drug use: No  ? ? ? ?Allergies   ?Patient has no known allergies. ? ? ?Review of Systems ?Review of Systems  ?Constitutional:  Positive for chills and fever.  ?HENT:  Negative for ear pain and sore throat.   ?Respiratory:  Negative for cough and shortness of breath.   ?Cardiovascular:  Negative for chest pain and palpitations.  ?Gastrointestinal:  Negative for abdominal pain, diarrhea and vomiting.  ?Skin:  Negative for color change and rash.  ?Neurological:  Positive for headaches. Negative for dizziness, syncope, facial asymmetry, speech difficulty, weakness and numbness.  ?All other systems reviewed and are negative. ? ? ?Physical Exam ?Triage Vital Signs ?ED Triage Vitals  ?Enc Vitals Group  ?   BP   ?   Pulse   ?   Resp   ?   Temp   ?   Temp src   ?   SpO2   ?   Weight   ?   Height   ?   Head Circumference   ?  Peak Flow   ?   Pain Score   ?   Pain Loc   ?   Pain Edu?   ?   Excl. in [place]?   ? ?No data found. ? ?Updated Vital Signs ?BP 119/78   Pulse 90   Temp 100.1 ?F (37.8 ?C)   Resp 18   SpO2 98%  ? ?Visual Acuity ?Right Eye Distance:   ?Left Eye Distance:   ?Bilateral Distance:   ? ?Right Eye Near:   ?Left Eye Near:    ?Bilateral Near:    ? ?Physical Exam ?Vitals and nursing note reviewed.  ?Constitutional:   ?   General: He is not in acute distress. ?   Appearance: Normal appearance. He is well-developed. He is ill-appearing.  ?HENT:  ?   Right Ear: Tympanic membrane normal.  ?   Left Ear: Tympanic membrane normal.  ?   Nose: Nose normal.  ?   Mouth/Throat:  ?   Mouth: Mucous membranes are moist.  ?   Pharynx: Oropharynx is clear.  ?Eyes:  ?   Conjunctiva/sclera: Conjunctivae normal.  ?Cardiovascular:  ?   Rate and  Rhythm: Normal rate and regular rhythm.  ?   Heart sounds: Normal heart sounds.  ?Pulmonary:  ?   Effort: Pulmonary effort is normal. No respiratory distress.  ?   Breath sounds: Normal breath sounds.  ?Abdominal:  ?   Palpations: Abdomen is soft.  ?   Tenderness: There is no abdominal tenderness.  ?Musculoskeletal:  ?   Cervical back: Neck supple.  ?Skin: ?   General: Skin is warm.  ?Neurological:  ?   Mental Status: He is alert.  ?Psychiatric:     ?   Mood and Affect: Mood normal.     ?   Behavior: Behavior normal.  ? ? ? ?UC Treatments / Results  ?Labs ?(all labs ordered are listed, but only abnormal results are displayed) ?Labs Reviewed  ?POCT INFLUENZA A/B  ? ? ?EKG ? ? ?Radiology ?No results found. ? ?Procedures ?Procedures (including critical care time) ? ?Medications Ordered in UC ?Medications - No data to display ? ?Initial Impression / Assessment and Plan / UC Course  ?I have reviewed the triage vital signs and the nursing notes. ? ?Pertinent labs & imaging results that were available during my care of the patient were reviewed by me and considered in my medical decision making (see chart for details). ? ?Viral illness.  Patient was seen at another urgent care yesterday and had negative rapid strep and negative rapid COVID test; he has a PCR COVID pending and expects the results on Monday.  Rapid flu negative here today.  Discussed continued symptomatic care including Tylenol or ibuprofen, rest, hydration.  Instructed patient to quarantine until the COVID test result is back.  ED precautions discussed.  Instructed him to follow-up with his PCP if he is not improving.  He agrees to plan of care. ? ? ?Final Clinical Impressions(s) / UC Diagnoses  ? ?Final diagnoses:  ?Viral illness  ? ? ? ?Discharge Instructions   ? ?  ?The flu test is negative. ? ?Take Tylenol or ibuprofen as needed for fever or discomfort.  Follow up with your primary care provider if your symptoms are not improving.   ? ? ? ? ? ?ED  Prescriptions   ?None ?  ? ?PDMP not reviewed this encounter. ?  ?Sharion Balloon, NP ?10/02/21 1505 ? ?

## 2021-10-02 NOTE — ED Triage Notes (Signed)
Provider triage  

## 2021-10-04 ENCOUNTER — Inpatient Hospital Stay
Admission: EM | Admit: 2021-10-04 | Discharge: 2021-10-08 | DRG: 442 | Disposition: A | Discharge status: Home Health | Payer: 59 | Attending: Internal Medicine | Admitting: Internal Medicine

## 2021-10-04 ENCOUNTER — Emergency Department: Payer: 59

## 2021-10-04 DIAGNOSIS — I700 Atherosclerosis of aorta: Secondary | ICD-10-CM | POA: Diagnosis present

## 2021-10-04 DIAGNOSIS — E860 Dehydration: Secondary | ICD-10-CM | POA: Diagnosis present

## 2021-10-04 DIAGNOSIS — D75839 Thrombocytosis, unspecified: Secondary | ICD-10-CM | POA: Diagnosis present

## 2021-10-04 DIAGNOSIS — Z79899 Other long term (current) drug therapy: Secondary | ICD-10-CM

## 2021-10-04 DIAGNOSIS — R509 Fever, unspecified: Secondary | ICD-10-CM | POA: Diagnosis present

## 2021-10-04 DIAGNOSIS — E878 Other disorders of electrolyte and fluid balance, not elsewhere classified: Secondary | ICD-10-CM | POA: Diagnosis present

## 2021-10-04 DIAGNOSIS — Z79890 Hormone replacement therapy (postmenopausal): Secondary | ICD-10-CM

## 2021-10-04 DIAGNOSIS — E871 Hypo-osmolality and hyponatremia: Secondary | ICD-10-CM | POA: Diagnosis present

## 2021-10-04 DIAGNOSIS — F32A Depression, unspecified: Secondary | ICD-10-CM | POA: Diagnosis present

## 2021-10-04 DIAGNOSIS — Z20822 Contact with and (suspected) exposure to covid-19: Secondary | ICD-10-CM | POA: Diagnosis present

## 2021-10-04 DIAGNOSIS — N179 Acute kidney failure, unspecified: Secondary | ICD-10-CM | POA: Diagnosis present

## 2021-10-04 DIAGNOSIS — K219 Gastro-esophageal reflux disease without esophagitis: Secondary | ICD-10-CM | POA: Diagnosis present

## 2021-10-04 DIAGNOSIS — K750 Abscess of liver: Principal | ICD-10-CM | POA: Diagnosis present

## 2021-10-04 DIAGNOSIS — R5081 Fever presenting with conditions classified elsewhere: Secondary | ICD-10-CM | POA: Diagnosis present

## 2021-10-04 DIAGNOSIS — R7982 Elevated C-reactive protein (CRP): Secondary | ICD-10-CM | POA: Diagnosis present

## 2021-10-04 DIAGNOSIS — G4700 Insomnia, unspecified: Secondary | ICD-10-CM | POA: Diagnosis present

## 2021-10-04 DIAGNOSIS — I959 Hypotension, unspecified: Secondary | ICD-10-CM | POA: Diagnosis present

## 2021-10-04 DIAGNOSIS — E785 Hyperlipidemia, unspecified: Secondary | ICD-10-CM | POA: Diagnosis present

## 2021-10-04 DIAGNOSIS — E039 Hypothyroidism, unspecified: Secondary | ICD-10-CM | POA: Diagnosis present

## 2021-10-04 LAB — CBC WITH DIFFERENTIAL/PLATELET
Abs Immature Granulocytes: 0.04 10*3/uL (ref 0.00–0.07)
Basophils Absolute: 0.1 10*3/uL (ref 0.0–0.1)
Basophils Relative: 1 %
Eosinophils Absolute: 0.1 10*3/uL (ref 0.0–0.5)
Eosinophils Relative: 1 %
HCT: 36.1 % — ABNORMAL LOW (ref 39.0–52.0)
Hemoglobin: 12.2 g/dL — ABNORMAL LOW (ref 13.0–17.0)
Immature Granulocytes: 0 %
Lymphocytes Relative: 22 %
Lymphs Abs: 2.5 10*3/uL (ref 0.7–4.0)
MCH: 29.3 pg (ref 26.0–34.0)
MCHC: 33.8 g/dL (ref 30.0–36.0)
MCV: 86.8 fL (ref 80.0–100.0)
Monocytes Absolute: 1.5 10*3/uL — ABNORMAL HIGH (ref 0.1–1.0)
Monocytes Relative: 13 %
Neutro Abs: 7.2 10*3/uL (ref 1.7–7.7)
Neutrophils Relative %: 63 %
Platelets: 423 10*3/uL — ABNORMAL HIGH (ref 150–400)
RBC: 4.16 MIL/uL — ABNORMAL LOW (ref 4.22–5.81)
RDW: 12.4 % (ref 11.5–15.5)
WBC: 11.4 10*3/uL — ABNORMAL HIGH (ref 4.0–10.5)
nRBC: 0 % (ref 0.0–0.2)

## 2021-10-04 LAB — LACTIC ACID, PLASMA: Lactic Acid, Venous: 1.3 mmol/L (ref 0.5–1.9)

## 2021-10-04 LAB — COMPREHENSIVE METABOLIC PANEL
ALT: 59 U/L — ABNORMAL HIGH (ref 0–44)
AST: 40 U/L (ref 15–41)
Albumin: 3.7 g/dL (ref 3.5–5.0)
Alkaline Phosphatase: 97 U/L (ref 38–126)
Anion gap: 10 (ref 5–15)
BUN: 15 mg/dL (ref 6–20)
CO2: 20 mmol/L — ABNORMAL LOW (ref 22–32)
Calcium: 9 mg/dL (ref 8.9–10.3)
Chloride: 102 mmol/L (ref 98–111)
Creatinine, Ser: 1.34 mg/dL — ABNORMAL HIGH (ref 0.61–1.24)
GFR, Estimated: >60 mL/min (ref >60)
Glucose, Bld: 111 mg/dL — ABNORMAL HIGH (ref 70–99)
Potassium: 3.7 mmol/L (ref 3.5–5.1)
Sodium: 132 mmol/L — ABNORMAL LOW (ref 135–145)
Total Bilirubin: 0.6 mg/dL (ref 0.3–1.2)
Total Protein: 7.5 g/dL (ref 6.5–8.1)

## 2021-10-04 LAB — PROTIME-INR
INR: 1.3 — ABNORMAL HIGH (ref 0.8–1.2)
Prothrombin Time: 16.1 seconds — ABNORMAL HIGH (ref 11.4–15.2)

## 2021-10-04 LAB — APTT: aPTT: 40 seconds — ABNORMAL HIGH (ref 24–36)

## 2021-10-04 LAB — RESP PANEL BY RT-PCR (FLU A&B, COVID) ARPGX2
Influenza A by PCR: NEGATIVE
Influenza B by PCR: NEGATIVE
SARS Coronavirus 2 by RT PCR: NEGATIVE

## 2021-10-04 MED ORDER — LACTATED RINGERS IV BOLUS (SEPSIS)
1000.0000 mL | Freq: Once | INTRAVENOUS | Status: AC
  Administered 2021-10-04: 1000 mL via INTRAVENOUS

## 2021-10-04 MED ORDER — DEXAMETHASONE SODIUM PHOSPHATE 10 MG/ML IJ SOLN
10.0000 mg | Freq: Once | INTRAMUSCULAR | Status: AC
  Administered 2021-10-04: 10 mg via INTRAVENOUS
  Filled 2021-10-04: qty 1

## 2021-10-04 MED ORDER — VANCOMYCIN HCL 2000 MG/400ML IV SOLN
2000.0000 mg | Freq: Once | INTRAVENOUS | Status: AC
  Administered 2021-10-04: 2000 mg via INTRAVENOUS
  Filled 2021-10-04: qty 400

## 2021-10-04 MED ORDER — PROCHLORPERAZINE EDISYLATE 10 MG/2ML IJ SOLN
10.0000 mg | Freq: Once | INTRAMUSCULAR | Status: AC
  Administered 2021-10-04: 10 mg via INTRAVENOUS
  Filled 2021-10-04: qty 2

## 2021-10-04 MED ORDER — LACTATED RINGERS IV BOLUS (SEPSIS)
1000.0000 mL | Freq: Once | INTRAVENOUS | Status: AC
  Administered 2021-10-05: 1000 mL via INTRAVENOUS

## 2021-10-04 MED ORDER — IBUPROFEN 600 MG PO TABS
600.0000 mg | ORAL_TABLET | ORAL | Status: AC
  Administered 2021-10-04: 600 mg via ORAL
  Filled 2021-10-04: qty 1

## 2021-10-04 MED ORDER — VANCOMYCIN HCL IN DEXTROSE 1-5 GM/200ML-% IV SOLN: 1000.0000 mg | Freq: Once | INTRAVENOUS | Status: DC

## 2021-10-04 MED ORDER — LIDOCAINE HCL (PF) 1 % IJ SOLN
5.0000 mL | Freq: Once | INTRAMUSCULAR | Status: DC
  Filled 2021-10-04: qty 5

## 2021-10-04 MED ORDER — SODIUM CHLORIDE 0.9 % IV SOLN
2.0000 g | Freq: Once | INTRAVENOUS | Status: AC
  Administered 2021-10-04: 2 g via INTRAVENOUS
  Filled 2021-10-04: qty 20

## 2021-10-04 NOTE — ED Triage Notes (Signed)
Pt presents via POV c/o fever since Wednesday and 10/10 headache unrelieved with medication.  ?

## 2021-10-04 NOTE — Sepsis Progress Note (Signed)
Elink following Code Sepsis. 

## 2021-10-04 NOTE — Consult Note (Signed)
PHARMACY -  BRIEF ANTIBIOTIC NOTE  ? ?Pharmacy has received consult(s) for vancomycin from an ED provider.  The patient's profile has been reviewed for ht/wt/allergies/indication/available labs.   ? ?One time order(s) placed for  ?Vancomycin 2000 mg  ? ?Further antibiotics/pharmacy consults should be ordered by admitting physician if indicated.       ?                ?Thank you, ?Dorothe Pea, PharmD, BCPS ?Clinical Pharmacist   ?10/04/2021  9:10 PM  ?

## 2021-10-04 NOTE — Consult Note (Signed)
CODE SEPSIS - PHARMACY COMMUNICATION ? ?**Broad Spectrum Antibiotics should be administered within 1 hour of Sepsis diagnosis** ? ?Time Code Sepsis Called/Page Received: 2110 ? ?Antibiotics Ordered: vancomycin, ceftriaxone ? ?Time of 1st antibiotic administration: 2157 ? ? ? ? ? ?Dorothe Pea ,PharmD, BCPS ?Clinical Pharmacist  ?10/04/2021  9:10 PM  ?

## 2021-10-04 NOTE — ED Notes (Signed)
Pt reporting 6/10 lower back pain. Reports head pain decrease to 7/10. ?

## 2021-10-04 NOTE — ED Provider Notes (Signed)
? ?[facility] ?Provider Note ? ? ? Event Date/Time  ? First MD Initiated Contact with Patient 10/04/21 2109   ?  (approximate) ? ? ?History  ? ?Fever and Headache ? ? ?HPI ? ?Jonathon Johnston is a 51 y.o. male  who, per urgent care note from 2 days ago was seen for fever chills body aches and headache, who presents to the emergency department today because of continued symptoms.  The patient states that his symptoms have now been going on for 5 days.  He says his headache is located primarily in the front of his head and it is severe.  He has body aches diffusely throughout his body.  Patient denies similar headache in the past.  He denies any known sick contacts.  Has been trying over-the-counter medications without any significant relief. ? ?Physical Exam  ? ?Triage Vital Signs: ?ED Triage Vitals  ?Enc Vitals Group  ?   BP 10/04/21 2054 110/88  ?   Pulse Rate 10/04/21 2054 (!) 112  ?   Resp 10/04/21 2054 (!) 24  ?   Temp 10/04/21 2054 (!) 102.2 ?F (39 ?C)  ?   Temp Source 10/04/21 2054 Oral  ?   SpO2 10/04/21 2054 98 %  ?   Weight 10/04/21 2057 220 lb (99.8 kg)  ?   Height 10/04/21 2057 6' (1.829 m)  ?   Head Circumference --   ?   Peak Flow --   ?   Pain Score 10/04/21 2054 10  ? ?Most recent vital signs: ?Vitals:  ? 10/04/21 2054  ?BP: 110/88  ?Pulse: (!) 112  ?Resp: (!) 24  ?Temp: (!) 102.2 ?F (39 ?C)  ?SpO2: 98%  ? ? ?General: Awake, alert and oriented. ?CV:  Good peripheral perfusion. Tachycardia, regular rate and rhythm. ?Resp:  Normal effort. Lungs clear to auscultation. ?Abd:  No distention. Non tender. ?Other:  Negative brudzinski kernig ? ?ED Results / Procedures / Treatments  ? ?Labs ?(all labs ordered are listed, but only abnormal results are displayed) ?Labs Reviewed  ?COMPREHENSIVE METABOLIC PANEL - Abnormal; Notable for the following components:  ?    Result Value  ? Sodium 132 (*)   ? CO2 20 (*)   ? Glucose, Bld 111 (*)   ? Creatinine, Ser 1.34 (*)   ? ALT 59 (*)   ? All  other components within normal limits  ?CBC WITH DIFFERENTIAL/PLATELET - Abnormal; Notable for the following components:  ? WBC 11.4 (*)   ? RBC 4.16 (*)   ? Hemoglobin 12.2 (*)   ? HCT 36.1 (*)   ? Platelets 423 (*)   ? Monocytes Absolute 1.5 (*)   ? All other components within normal limits  ?PROTIME-INR - Abnormal; Notable for the following components:  ? Prothrombin Time 16.1 (*)   ? INR 1.3 (*)   ? All other components within normal limits  ?APTT - Abnormal; Notable for the following components:  ? aPTT 40 (*)   ? All other components within normal limits  ?RESP PANEL BY RT-PCR (FLU A&B, COVID) [id number]  ?CULTURE, BLOOD (ROUTINE X 2)  ?CULTURE, BLOOD (ROUTINE X 2)  ?URINE CULTURE  ?CSF CULTURE W GRAM STAIN  ?LACTIC ACID, PLASMA  ?URINALYSIS, ROUTINE W REFLEX MICROSCOPIC  ?CSF CELL COUNT WITH DIFFERENTIAL  ?CSF CELL COUNT WITH DIFFERENTIAL  ?PROTEIN AND GLUCOSE, CSF  ?LACTIC ACID, PLASMA  ?HIV ANTIBODY (ROUTINE TESTING W REFLEX)  ? ? ? ?EKG ? ? ?I, Nance Pear,  attending physician, personally viewed and interpreted this EKG ? ?EKG Time: 2133 ?Rate: 91 ?Rhythm: sinus rhythm ?Axis: normal ?Intervals: qtc 469 ?QRS: narrow ?ST changes: no st elevation ?Impression: normal ekg ? ? ?RADIOLOGY ?I independently interpreted and visualized the CXR. My interpretation: No pneumonia. No pneumothorax.  ?Radiology interpretation:  ?IMPRESSION:  ?No acute chest findings.  ? ? ? ?PROCEDURES: ? ?Critical Care performed: No ? ?Procedures ? ? ?IMPRESSION / MDM / ASSESSMENT AND PLAN / ED COURSE  ?I reviewed the triage vital signs and the nursing notes. ?             ?               ? ?Differential diagnosis includes, but is not limited to, viral URI, meningitis, pneumonia. ? ?Patient presents to the emergency department today with concerns for fever as well as severe headache.  On exam patient appears uncomfortable.  Patient without Brudzinski or Kernig signs.  I did have lower suspicion for meningitis given clinical exam however  chest x-ray and viral panel without clear etiology.  I did have a discussion with the patient about possible meningitis.  Did discuss the risk and benefits.  Patient did want to proceed.  I did attempt lumbar puncture in the emergency department was unsuccessful in obtaining CSF.  Patient was covered with broad-spectrum IV antibiotics.  Discussed with Dr. Sidney Ace with hospitalist service who will plan on admission. ? ?FINAL CLINICAL IMPRESSION(S) / ED DIAGNOSES  ? ?Final diagnoses:  ?Fever in other diseases  ? ? ? ?Note:  This document was prepared using Dragon voice recognition software and may include unintentional dictation errors. ? ?  ?Nance Pear, MD ?10/05/21 1613 ? ?

## 2021-10-05 ENCOUNTER — Other Ambulatory Visit: Payer: Self-pay

## 2021-10-05 ENCOUNTER — Inpatient Hospital Stay: Payer: 59 | Admitting: Radiology

## 2021-10-05 DIAGNOSIS — Z79890 Hormone replacement therapy (postmenopausal): Secondary | ICD-10-CM | POA: Diagnosis not present

## 2021-10-05 DIAGNOSIS — E785 Hyperlipidemia, unspecified: Secondary | ICD-10-CM | POA: Diagnosis present

## 2021-10-05 DIAGNOSIS — K750 Abscess of liver: Secondary | ICD-10-CM | POA: Diagnosis present

## 2021-10-05 DIAGNOSIS — D75839 Thrombocytosis, unspecified: Secondary | ICD-10-CM | POA: Diagnosis present

## 2021-10-05 DIAGNOSIS — F32A Depression, unspecified: Secondary | ICD-10-CM | POA: Diagnosis present

## 2021-10-05 DIAGNOSIS — Z20822 Contact with and (suspected) exposure to covid-19: Secondary | ICD-10-CM | POA: Diagnosis present

## 2021-10-05 DIAGNOSIS — N179 Acute kidney failure, unspecified: Secondary | ICD-10-CM | POA: Diagnosis present

## 2021-10-05 DIAGNOSIS — E860 Dehydration: Secondary | ICD-10-CM | POA: Diagnosis present

## 2021-10-05 DIAGNOSIS — G4700 Insomnia, unspecified: Secondary | ICD-10-CM | POA: Diagnosis present

## 2021-10-05 DIAGNOSIS — E871 Hypo-osmolality and hyponatremia: Secondary | ICD-10-CM | POA: Diagnosis present

## 2021-10-05 DIAGNOSIS — R509 Fever, unspecified: Secondary | ICD-10-CM | POA: Diagnosis present

## 2021-10-05 DIAGNOSIS — E039 Hypothyroidism, unspecified: Secondary | ICD-10-CM

## 2021-10-05 DIAGNOSIS — R7982 Elevated C-reactive protein (CRP): Secondary | ICD-10-CM | POA: Diagnosis present

## 2021-10-05 DIAGNOSIS — Z79899 Other long term (current) drug therapy: Secondary | ICD-10-CM | POA: Diagnosis not present

## 2021-10-05 DIAGNOSIS — K219 Gastro-esophageal reflux disease without esophagitis: Secondary | ICD-10-CM | POA: Diagnosis present

## 2021-10-05 DIAGNOSIS — R5081 Fever presenting with conditions classified elsewhere: Secondary | ICD-10-CM | POA: Diagnosis present

## 2021-10-05 DIAGNOSIS — I700 Atherosclerosis of aorta: Secondary | ICD-10-CM | POA: Diagnosis present

## 2021-10-05 DIAGNOSIS — I959 Hypotension, unspecified: Secondary | ICD-10-CM | POA: Diagnosis present

## 2021-10-05 DIAGNOSIS — E878 Other disorders of electrolyte and fluid balance, not elsewhere classified: Secondary | ICD-10-CM | POA: Diagnosis present

## 2021-10-05 HISTORY — PX: IR US GUIDE BX ASP/DRAIN: IMG2392

## 2021-10-05 LAB — BASIC METABOLIC PANEL
Anion gap: 7 (ref 5–15)
BUN: 15 mg/dL (ref 6–20)
CO2: 21 mmol/L — ABNORMAL LOW (ref 22–32)
Calcium: 8.5 mg/dL — ABNORMAL LOW (ref 8.9–10.3)
Chloride: 109 mmol/L (ref 98–111)
Creatinine, Ser: 1.09 mg/dL (ref 0.61–1.24)
GFR, Estimated: >60 mL/min (ref >60)
Glucose, Bld: 169 mg/dL — ABNORMAL HIGH (ref 70–99)
Potassium: 4.1 mmol/L (ref 3.5–5.1)
Sodium: 137 mmol/L (ref 135–145)

## 2021-10-05 LAB — CBC
HCT: 35.6 % — ABNORMAL LOW (ref 39.0–52.0)
Hemoglobin: 11.8 g/dL — ABNORMAL LOW (ref 13.0–17.0)
MCH: 29.6 pg (ref 26.0–34.0)
MCHC: 33.1 g/dL (ref 30.0–36.0)
MCV: 89.4 fL (ref 80.0–100.0)
Platelets: 364 10*3/uL (ref 150–400)
RBC: 3.98 MIL/uL — ABNORMAL LOW (ref 4.22–5.81)
RDW: 12.5 % (ref 11.5–15.5)
WBC: 6.9 10*3/uL (ref 4.0–10.5)
nRBC: 0 % (ref 0.0–0.2)

## 2021-10-05 LAB — URINALYSIS, ROUTINE W REFLEX MICROSCOPIC
Bilirubin Urine: NEGATIVE
Glucose, UA: NEGATIVE mg/dL
Hgb urine dipstick: NEGATIVE
Ketones, ur: 5 mg/dL — AB
Leukocytes,Ua: NEGATIVE
Nitrite: NEGATIVE
Protein, ur: NEGATIVE mg/dL
Specific Gravity, Urine: 1.013 (ref 1.005–1.030)
pH: 9 — ABNORMAL HIGH (ref 5.0–8.0)

## 2021-10-05 LAB — LACTIC ACID, PLASMA: Lactic Acid, Venous: 0.8 mmol/L (ref 0.5–1.9)

## 2021-10-05 MED ORDER — FENTANYL CITRATE (PF) 100 MCG/2ML IJ SOLN
INTRAMUSCULAR | Status: AC | PRN
Start: 2021-10-05 — End: 2021-10-05
  Administered 2021-10-05: 50 ug via INTRAVENOUS

## 2021-10-05 MED ORDER — BUPROPION HCL ER (XL) 150 MG PO TB24
300.0000 mg | ORAL_TABLET | Freq: Every morning | ORAL | Status: DC
  Administered 2021-10-05 – 2021-10-08 (×4): 300 mg via ORAL
  Filled 2021-10-05 (×4): qty 2

## 2021-10-05 MED ORDER — ACETAMINOPHEN 325 MG PO TABS
650.0000 mg | ORAL_TABLET | Freq: Four times a day (QID) | ORAL | Status: DC | PRN
  Administered 2021-10-05 – 2021-10-06 (×2): 650 mg via ORAL
  Filled 2021-10-05 (×2): qty 2

## 2021-10-05 MED ORDER — FENTANYL CITRATE (PF) 100 MCG/2ML IJ SOLN
INTRAMUSCULAR | Status: AC
  Filled 2021-10-05: qty 2

## 2021-10-05 MED ORDER — SUVOREXANT 10 MG PO TABS: 2.0000 | ORAL_TABLET | Freq: Every day | ORAL | Status: DC

## 2021-10-05 MED ORDER — METRONIDAZOLE 500 MG/100ML IV SOLN
500.0000 mg | Freq: Three times a day (TID) | INTRAVENOUS | Status: DC
Start: 2021-10-05 — End: 2021-10-08
  Administered 2021-10-05 – 2021-10-08 (×11): 500 mg via INTRAVENOUS
  Filled 2021-10-05 (×12): qty 100

## 2021-10-05 MED ORDER — SODIUM CHLORIDE 0.9 % IV SOLN
2.0000 g | Freq: Two times a day (BID) | INTRAVENOUS | Status: DC
  Administered 2021-10-05: 2 g via INTRAVENOUS
  Filled 2021-10-05: qty 20

## 2021-10-05 MED ORDER — ZOLPIDEM TARTRATE 5 MG PO TABS
10.0000 mg | ORAL_TABLET | Freq: Every day | ORAL | Status: DC
  Filled 2021-10-05: qty 2

## 2021-10-05 MED ORDER — ENOXAPARIN SODIUM 40 MG/0.4ML IJ SOSY
40.0000 mg | PREFILLED_SYRINGE | INTRAMUSCULAR | Status: DC
  Administered 2021-10-06 – 2021-10-08 (×3): 40 mg via SUBCUTANEOUS
  Filled 2021-10-05 (×3): qty 0.4

## 2021-10-05 MED ORDER — LIDOCAINE HCL 1 % IJ SOLN
INTRAMUSCULAR | Status: AC
  Administered 2021-10-05: 10 mL
  Filled 2021-10-05: qty 20

## 2021-10-05 MED ORDER — ONDANSETRON HCL 4 MG PO TABS: 4.0000 mg | ORAL_TABLET | Freq: Four times a day (QID) | ORAL | Status: DC | PRN

## 2021-10-05 MED ORDER — ADULT MULTIVITAMIN W/MINERALS CH
1.0000 | ORAL_TABLET | Freq: Every day | ORAL | Status: DC
  Administered 2021-10-05 – 2021-10-08 (×4): 1 via ORAL
  Filled 2021-10-05 (×4): qty 1

## 2021-10-05 MED ORDER — MIDAZOLAM HCL 2 MG/2ML IJ SOLN
INTRAMUSCULAR | Status: AC | PRN
  Administered 2021-10-05 (×2): 1 mg via INTRAVENOUS

## 2021-10-05 MED ORDER — LEVOTHYROXINE SODIUM 50 MCG PO TABS
50.0000 ug | ORAL_TABLET | Freq: Every day | ORAL | Status: DC
  Administered 2021-10-05 – 2021-10-08 (×4): 50 ug via ORAL
  Filled 2021-10-05 (×4): qty 1

## 2021-10-05 MED ORDER — SODIUM CHLORIDE 0.9 % IV SOLN: INTRAVENOUS | Status: DC

## 2021-10-05 MED ORDER — MIDAZOLAM HCL 2 MG/2ML IJ SOLN
INTRAMUSCULAR | Status: AC
  Filled 2021-10-05: qty 2

## 2021-10-05 MED ORDER — TRAZODONE HCL 50 MG PO TABS
25.0000 mg | ORAL_TABLET | Freq: Every evening | ORAL | Status: DC | PRN
  Administered 2021-10-05 – 2021-10-07 (×3): 25 mg via ORAL
  Filled 2021-10-05 (×3): qty 1

## 2021-10-05 MED ORDER — PANTOPRAZOLE SODIUM 40 MG PO TBEC
40.0000 mg | DELAYED_RELEASE_TABLET | Freq: Every day | ORAL | Status: DC
  Administered 2021-10-06 – 2021-10-08 (×3): 40 mg via ORAL
  Filled 2021-10-05 (×3): qty 1

## 2021-10-05 MED ORDER — CITALOPRAM HYDROBROMIDE 20 MG PO TABS
20.0000 mg | ORAL_TABLET | Freq: Every day | ORAL | Status: DC
  Administered 2021-10-05 – 2021-10-07 (×3): 20 mg via ORAL
  Filled 2021-10-05 (×3): qty 1

## 2021-10-05 MED ORDER — ACETAMINOPHEN 650 MG RE SUPP: 650.0000 mg | Freq: Four times a day (QID) | RECTAL | Status: DC | PRN

## 2021-10-05 MED ORDER — CEFTRIAXONE SODIUM 2 G IJ SOLR
2.0000 g | INTRAMUSCULAR | Status: DC
  Administered 2021-10-06 – 2021-10-08 (×3): 2 g via INTRAVENOUS
  Filled 2021-10-05: qty 2
  Filled 2021-10-05: qty 20
  Filled 2021-10-05: qty 2

## 2021-10-05 MED ORDER — ONDANSETRON HCL 4 MG/2ML IJ SOLN: 4.0000 mg | Freq: Four times a day (QID) | INTRAMUSCULAR | Status: DC | PRN

## 2021-10-05 MED ORDER — MAGNESIUM HYDROXIDE 400 MG/5ML PO SUSP: 30.0000 mL | Freq: Every day | ORAL | Status: DC | PRN

## 2021-10-05 MED ORDER — IOHEXOL 300 MG/ML  SOLN
100.0000 mL | Freq: Once | INTRAMUSCULAR | Status: AC | PRN
  Administered 2021-10-04: 100 mL via INTRAVENOUS

## 2021-10-05 MED ORDER — TEMAZEPAM 7.5 MG PO CAPS
15.0000 mg | ORAL_CAPSULE | Freq: Every day | ORAL | Status: DC | PRN
  Administered 2021-10-05 – 2021-10-07 (×3): 15 mg via ORAL
  Filled 2021-10-05 (×3): qty 2

## 2021-10-05 NOTE — Procedures (Signed)
Interventional Radiology Procedure Note ? ?Date of Procedure: 10/05/2021  ?Procedure: IR biopsy of liver lesion  ? ?Findings:  ?1. Biopsy of liver lesion demonstrates more solid mass, unable to aspirate any fluid. Core needle biopsy performed, 18ga x5 passes, with specimen sent to both path and micro.   ? ?Complications: No immediate complications noted.  ? ?Estimated Blood Loss: minimal ? ?Follow-up and Recommendations: ?Bedrest 2 hours   ?Follow up biopsy results  ? ? ?Albin Felling, MD  ?Vascular & Interventional Radiology  ?10/05/2021 12:37 PM ? ? ? ?

## 2021-10-05 NOTE — Consult Note (Signed)
Consultation ? ?Referring Provider:     Dr Sidney Ace ?Admit date 10/05/21 ?Consult date        10/04/21 ?Reason for Consultation:    Hepatic abscess  ?       ? HPI:   ?Jonathon Johnston is a 51 y.o. male with history of mild depression and gerd admitted with headache/fever/bilateral flank pain who was found to have a suspected hepatic abcess on CT A/P. He states he is feeling better today- headache down to a 2/10 and no flank pain. Fever improved. Is on rocephin and metronidazole. States his wife had HCV but was successfully treated some years ago. He says he has been screened but was negative. He was in the [employer], has a tattoo. Denies any history of liver disease, illicits, foreign travel, eating sushi, history of colon cancer- states he did have a colonoscopy 5y ago that demonstrated a polyp and is due to repeat this year.  He denies any specific abdominal complaints.  ?ALT was 59, liver panel otherwise normal. CBC with some initial leukocytosis and thrombocytosis that has normalized today. Blood cultures unremarkable at the <12h mark. Pt/inr 16/1 and 1.3 ? ?IMPRESSION: ?Somewhat ill-defined low-density lesion in the left hepatic lobe ?measuring up to 4.1 cm concerning for possible hepatic abscess. ? Scattered aortic atherosclerosis. ? Moderate stool burden throughout the colon. ? ?PREVIOUS ENDOSCOPIES:            ?Reported colonoscopy as above, thinks it was done by Howie Ill - I don't have the report ? ?Past Medical History:  ?Diagnosis Date  ? GERD (gastroesophageal reflux disease)   ? ? ?Past Surgical History:  ?Procedure Laterality Date  ? APPENDECTOMY    ? ? ?History reviewed. No pertinent family history.  ? ?Social History  ? ?Tobacco Use  ? Smoking status: Never  ? Smokeless tobacco: Never  ?Substance Use Topics  ? Alcohol use: No  ?  Comment: occasionally   ? Drug use: No  ? ? ?Prior to Admission medications   ?Medication Sig Start Date End Date Taking? Authorizing Provider  ?BELSOMRA 10 MG TABS Take 2  tablets by mouth at bedtime. 08/25/21  Yes [provider]  ?buPROPion (WELLBUTRIN XL) 300 MG 24 hr tablet Take 300 mg by mouth every morning. 07/25/21  Yes [provider]  ?citalopram (CELEXA) 20 MG tablet Take 20 mg by mouth at bedtime. 07/16/21  Yes [provider]  ?eszopiclone (LUNESTA) 1 MG TABS tablet Take 1 mg by mouth at bedtime. 10/01/21  Yes [provider]  ?levothyroxine (SYNTHROID) 50 MCG tablet Take 50 mcg by mouth every morning. 07/12/21  Yes [provider]  ?Multiple Vitamin (ONE DAILY) tablet Take 1 tablet by mouth daily.   Yes [provider]  ?omeprazole (PRILOSEC) 20 MG capsule 1 capsule 30 minutes before morning meal   Yes [provider]  ?rosuvastatin (CRESTOR) 10 MG tablet Take 10 mg by mouth daily. 07/12/21  Yes [provider]  ?temazepam (RESTORIL) 15 MG capsule Take 15 mg by mouth daily as needed. 08/31/21  Yes [provider]  ? ? ?Current Facility-Administered Medications  ?Medication Dose Route Frequency Provider Last Rate Last Admin  ? 0.9 %  sodium chloride infusion   Intravenous Continuous Mansy, Arvella Merles, MD 100 mL/hr at 10/05/21 0342 New Bag at 10/05/21 0342  ? acetaminophen (TYLENOL) tablet 650 mg  650 mg Oral Q6H PRN Mansy, Arvella Merles, MD      ? Or  ? acetaminophen (  TYLENOL) suppository 650 mg  650 mg Rectal Q6H PRN Mansy, Jan A, MD      ? buPROPion (WELLBUTRIN XL) 24 hr tablet 300 mg  300 mg Oral q morning Mansy, Jan A, MD      ? cefTRIAXone (ROCEPHIN) 2 g in sodium chloride 0.9 % 100 mL IVPB  2 g Intravenous Q12H Mansy, Jan A, MD      ? citalopram (CELEXA) tablet 20 mg  20 mg Oral QHS Mansy, Jan A, MD      ? enoxaparin (LOVENOX) injection 40 mg  40 mg Subcutaneous Q24H Mansy, Jan A, MD      ? levothyroxine (SYNTHROID) tablet 50 mcg  50 mcg Oral Q0600 Mansy, Jan A, MD   50 mcg at 10/05/21 1740  ? lidocaine (PF) (XYLOCAINE) 1 % injection 5 mL  5 mL Other Once Nance Pear, MD      ? magnesium hydroxide (MILK  OF MAGNESIA) suspension 30 mL  30 mL Oral Daily PRN Mansy, Jan A, MD      ? metroNIDAZOLE (FLAGYL) IVPB 500 mg  500 mg Intravenous Q8H Mansy, Arvella Merles, MD   Stopped at 10/05/21 0455  ? multivitamin with minerals tablet 1 tablet  1 tablet Oral Daily Mansy, Jan A, MD      ? ondansetron [facility]) tablet 4 mg  4 mg Oral Q6H PRN Mansy, Jan A, MD      ? Or  ? ondansetron [facility]) injection 4 mg  4 mg Intravenous Q6H PRN Mansy, Jan A, MD      ? Derrill Memo ON 10/06/2021] pantoprazole (PROTONIX) EC tablet 40 mg  40 mg Oral Daily Mansy, Jan A, MD      ? temazepam (RESTORIL) capsule 15 mg  15 mg Oral Daily PRN Mansy, Jan A, MD      ? traZODone (DESYREL) tablet 25 mg  25 mg Oral QHS PRN Mansy, Jan A, MD      ? zolpidem (AMBIEN) tablet 10 mg  10 mg Oral QHS Mansy, Arvella Merles, MD      ? ?Current Outpatient Medications  ?Medication Sig Dispense Refill  ? BELSOMRA 10 MG TABS Take 2 tablets by mouth at bedtime.    ? buPROPion (WELLBUTRIN XL) 300 MG 24 hr tablet Take 300 mg by mouth every morning.    ? citalopram (CELEXA) 20 MG tablet Take 20 mg by mouth at bedtime.    ? eszopiclone (LUNESTA) 1 MG TABS tablet Take 1 mg by mouth at bedtime.    ? levothyroxine (SYNTHROID) 50 MCG tablet Take 50 mcg by mouth every morning.    ? Multiple Vitamin (ONE DAILY) tablet Take 1 tablet by mouth daily.    ? omeprazole (PRILOSEC) 20 MG capsule 1 capsule 30 minutes before morning meal    ? rosuvastatin (CRESTOR) 10 MG tablet Take 10 mg by mouth daily.    ? temazepam (RESTORIL) 15 MG capsule Take 15 mg by mouth daily as needed.    ? ? ?Allergies as of 10/04/2021  ? (No Known Allergies)  ? ? ? ?Review of Systems:    ?All systems reviewed and negative except where noted in HPI. ? ? ? ? Physical Exam:  ?Vital signs in last 24 hours: ?Temp:  [99.7 ?F (37.6 ?C)-102.2 ?F (39 ?C)] 99.7 ?F (37.6 ?C) (05/08 2250) ?Pulse Rate:  [55-112] 55 (05/09 0800) ?Resp:  [10-27] 18 (05/09 0800) ?BP: (93-125)/(61-88) 99/61 (05/09 0800) ?SpO2:  [95 %-100 %] 96 % (05/09 0800) ?Weight:   [99.8  kg] 99.8 kg (05/08 2057) ?  ?General:   Pleasant man in NAD ?Head:  Normocephalic and atraumatic. ?Eyes:   No icterus.   Conjunctiva pink. ?Ears:  Normal auditory acuity. ?Mouth: Mucosa pink moist, no lesions. ?Neck:  Supple; no masses felt ?Lungs:  Respirations even and unlabored. Lungs clear to auscultation bilaterally.   No wheezes, crackles, or rhonchi.  ?Heart:  S1S2, RRR, no MRG. No edema. ?Abdomen:   Flat, soft, nondistended, nontender. Normal bowel sounds. No appreciable masses or hepatomegaly. No rebound signs or other peritoneal signs. ?Rectal:  Not performed.  ?Msk:  MAEW x4, No clubbing or cyanosis. Strength 5/5. Symmetrical without gross deformities. ?Neurologic:  Alert and  oriented x4;  Cranial nerves II-XII intact.  ?Skin:  Warm, dry, pink without significant lesions or rashes. ?Psych:  Alert and cooperative. Normal affect. ? ?LAB RESULTS: ?Recent Labs  ?  10/04/21 ?2056 10/05/21 ?0442  ?WBC 11.4* 6.9  ?HGB 12.2* 11.8*  ?HCT 36.1* 35.6*  ?PLT 423* 364  ? ?BMET ?Recent Labs  ?  10/04/21 ?2056 10/05/21 ?0442  ?NA 132* 137  ?K 3.7 4.1  ?CL 102 109  ?CO2 20* 21*  ?GLUCOSE 111* 169*  ?BUN 15 15  ?CREATININE 1.34* 1.09  ?CALCIUM 9.0 8.5*  ? ?LFT ?Recent Labs  ?  10/04/21 ?2056  ?PROT 7.5  ?ALBUMIN 3.7  ?AST 40  ?ALT 59*  ?ALKPHOS 97  ?BILITOT 0.6  ? ?PT/INR ?Recent Labs  ?  10/04/21 ?2115  ?LABPROT 16.1*  ?INR 1.3*  ? ? ?STUDIES: ?CT Head Wo Contrast ? ?Result Date: 10/04/2021 ?CLINICAL DATA:  Headache EXAM: CT HEAD WITHOUT CONTRAST TECHNIQUE: Contiguous axial images were obtained from the base of the skull through the vertex without intravenous contrast. RADIATION DOSE REDUCTION: This exam was performed according to the departmental dose-optimization program which includes automated exposure control, adjustment of the mA and/or kV according to patient size and/or use of iterative reconstruction technique. COMPARISON:  None Available. FINDINGS: Brain: No acute intracranial abnormality. Specifically, no  hemorrhage, hydrocephalus, mass lesion, acute infarction, or significant intracranial injury. Vascular: No hyperdense vessel or unexpected calcification. Skull: No acute calvarial abnormality. Sinuses/Orbi

## 2021-10-05 NOTE — Assessment & Plan Note (Addendum)
Continue PPI therapy. 

## 2021-10-05 NOTE — Progress Notes (Signed)
PHARMACY NOTE:  ANTIMICROBIAL RENAL DOSAGE ADJUSTMENT ? ?Current antimicrobial regimen includes a mismatch between antimicrobial dosage and estimated renal function.  As per policy approved by the Pharmacy & Therapeutics and Medical Executive Committees, the antimicrobial dosage will be adjusted accordingly. ? ?Current antimicrobial dosage/indication:  Ceftriaxone 2 g IV Q12H - pt was r/o for meningitis. Discussed indication with Dr. Reesa Chew [facility] vs meningitis - does not think meningitis.  ? ?Renal Function: ? ?Estimated Creatinine Clearance: 97 mL/min (by C-G formula based on SCr of 1.09 mg/dL). ?'[]'$      On intermittent HD, scheduled: ?'[]'$      On CRRT ?   ?Antimicrobial dosage has been changed to:  Ceftriaxone 2 g IV Q24H ? ?Additional comments: ? ? ?Thank you for allowing pharmacy to be a part of this patient's care. ? ?Sherilyn Banker, [facility] ?10/05/2021 1:27 PM ?

## 2021-10-05 NOTE — H&P (Signed)
?  ?  ?[place] ? ? ?PATIENT NAME: Nial Joye   ? ?MR#:  [id number] ? ?DATE OF BIRTH:  [birth year] ? ?DATE OF ADMISSION:  10/04/2021 ? ?PRIMARY CARE PHYSICIAN: Marda Stalker, PA-C  ? ?Patient is coming from: Home ? ?REQUESTING/REFERRING PHYSICIAN: Nance Pear, MD ? ?CHIEF COMPLAINT:  ? ?Chief Complaint  ?Patient presents with  ? Fever  ? Headache  ? ? ?HISTORY OF PRESENT ILLNESS:  ?JAYLEE LANTRY is a 51 y.o. Caucasian male with medical history significant for mild depression, insomnia and GERD, who presented to the emergency room with acute onset of fever that started on Wednesday with a Tmax of 102.9 and headache that started on Tuesday that is frontal and parietal.  His fever has not been responding much to Tylenol and ibuprofen.  He admits to nonproductive cough yesterday without dyspnea or wheezing.  No sore throat or earache or rhinorrhea or nasal congestion.  He has been having diminished urine output but denies any dysuria, urinary frequency or urgency or flank pain.  He denied any nausea or vomiting or diarrhea.  No chest pain or palpitations.  No paresthesias or focal muscle weakness.  He admitted to back pain as well as bilateral flank pain. ? ?ED Course: Upon presentation to the ER, temperature was 102.2 with heart rate 112 and respirate of 24 and pulse symmetry was 98 % initiated on CPAP and later on nasal cannula.  Labs revealed mild anemia and UA was unremarkable.  BMP revealed hyponatremia 130 and hypochloremia of 97 with a creatinine 1.43.  High-sensitivity troponin I was 516 CBC was within normal.  Influenza antigens and COVID-19 PCR came back negative.  Lumbar puncture was attempted by the ER physician but was unfortunately unsuccessful.  ?Imaging: 2 view chest ray showed no acute cardiopulmonary disease. ? ?The patient was given IV Rocephin and vancomycin as well as 10 mg of IV Decadron and 3 L bolus of IV lactated Ringer.  He will be admitted to a medical telemetry bed for further  evaluation and management. ?PAST MEDICAL HISTORY:  ? ?Past Medical History:  ?Diagnosis Date  ? GERD (gastroesophageal reflux disease)   ?-Mild depression ?- Insomnia ? ?PAST SURGICAL HISTORY:  ? ?Past Surgical History:  ?Procedure Laterality Date  ? APPENDECTOMY    ? ? ?SOCIAL HISTORY:  ? ?Social History  ? ?Tobacco Use  ? Smoking status: Never  ? Smokeless tobacco: Never  ?Substance Use Topics  ? Alcohol use: No  ?  Comment: occasionally   ? ? ?FAMILY HISTORY:  ?Positive for lung cancer in his mother. ?DRUG ALLERGIES:  ?No Known Allergies ? ?REVIEW OF SYSTEMS:  ? ?ROS ?As per history of present illness. All pertinent systems were reviewed above. Constitutional, HEENT, cardiovascular, respiratory, GI, GU, musculoskeletal, neuro, psychiatric, endocrine, integumentary and hematologic systems were reviewed and are otherwise negative/unremarkable except for positive findings mentioned above in the HPI. ? ? ?MEDICATIONS AT HOME:  ? ?Prior to Admission medications   ?Medication Sig Start Date End Date Taking? Authorizing Provider  ?BELSOMRA 10 MG TABS Take 2 tablets by mouth at bedtime. 08/25/21  Yes [provider]  ?buPROPion (WELLBUTRIN XL) 300 MG 24 hr tablet Take 300 mg by mouth every morning. 07/25/21  Yes [provider]  ?citalopram (CELEXA) 20 MG tablet Take 20 mg by mouth at bedtime. 07/16/21  Yes [provider]  ?eszopiclone (LUNESTA) 1 MG TABS tablet Take 1 mg by mouth at bedtime. 10/01/21  Yes [provider]  ?levothyroxine (  SYNTHROID) 50 MCG tablet Take 50 mcg by mouth every morning. 07/12/21  Yes [provider]  ?Multiple Vitamin (ONE DAILY) tablet Take 1 tablet by mouth daily.   Yes [provider]  ?omeprazole (PRILOSEC) 20 MG capsule 1 capsule 30 minutes before morning meal   Yes [provider]  ?rosuvastatin (CRESTOR) 10 MG tablet Take 10 mg by mouth daily. 07/12/21  Yes [provider]  ?temazepam (RESTORIL) 15 MG capsule Take 15 mg  by mouth daily as needed. 08/31/21  Yes [provider]  ? ?  ? ?VITAL SIGNS:  ?Blood pressure 93/65, pulse (!) 58, temperature 99.7 ?F (37.6 ?C), temperature source Oral, resp. rate 20, height 6' (1.829 m), weight 99.8 kg, SpO2 95 %. ? ?PHYSICAL EXAMINATION:  ?Physical Exam ? ?GENERAL:  51 y.o.-year-old Caucasian male patient lying in the bed with no acute distress.  ?EYES: Pupils equal, round, reactive to light and accommodation. No scleral icterus. Extraocular muscles intact.  ?HEENT: Head atraumatic, normocephalic. Oropharynx and nasopharynx clear.  ?NECK:  Supple, no jugular venous distention. No thyroid enlargement, no tenderness.  ?LUNGS: Normal breath sounds bilaterally, no wheezing, rales,rhonchi or crepitation. No use of accessory muscles of respiration.  ?CARDIOVASCULAR: Regular rate and rhythm, S1, S2 normal. No murmurs, rubs, or gallops.  ?ABDOMEN: Soft, nondistended, with minimal right upper quadrant tenderness without rebound tenderness guarding or rigidity.. Bowel sounds present. No organomegaly or mass.  ?EXTREMITIES: No pedal edema, cyanosis, or clubbing.  ?NEUROLOGIC: Cranial nerves II through XII are intact. Muscle strength 5/5 in all extremities. Sensation intact. Gait not checked.  ?PSYCHIATRIC: The patient is alert and oriented x 3.  Normal affect and good eye contact. ?SKIN: No obvious rash, lesion, or ulcer.  ? ?LABORATORY PANEL:  ? ?CBC ?Recent Labs  ?Lab 10/05/21 ?6440  ?WBC 6.9  ?HGB 11.8*  ?HCT 35.6*  ?PLT 364  ? ?------------------------------------------------------------------------------------------------------------------ ? ?Chemistries  ?Recent Labs  ?Lab 10/04/21 ?2056  ?NA 132*  ?K 3.7  ?CL 102  ?CO2 20*  ?GLUCOSE 111*  ?BUN 15  ?CREATININE 1.34*  ?CALCIUM 9.0  ?AST 40  ?ALT 59*  ?ALKPHOS 97  ?BILITOT 0.6  ? ?------------------------------------------------------------------------------------------------------------------ ? ?Cardiac Enzymes ?No results for input(s):  TROPONINI in the last 168 hours. ?------------------------------------------------------------------------------------------------------------------ ? ?RADIOLOGY:  ?CT Head Wo Contrast ? ?Result Date: 10/04/2021 ?CLINICAL DATA:  Headache EXAM: CT HEAD WITHOUT CONTRAST TECHNIQUE: Contiguous axial images were obtained from the base of the skull through the vertex without intravenous contrast. RADIATION DOSE REDUCTION: This exam was performed according to the departmental dose-optimization program which includes automated exposure control, adjustment of the mA and/or kV according to patient size and/or use of iterative reconstruction technique. COMPARISON:  None Available. FINDINGS: Brain: No acute intracranial abnormality. Specifically, no hemorrhage, hydrocephalus, mass lesion, acute infarction, or significant intracranial injury. Vascular: No hyperdense vessel or unexpected calcification. Skull: No acute calvarial abnormality. Sinuses/Orbits: No acute findings Other: None IMPRESSION: Normal study Electronically Signed   By: Rolm Baptise M.D.   On: 10/04/2021 22:10  ? ?CT ABDOMEN PELVIS W CONTRAST ? ?Result Date: 10/05/2021 ?CLINICAL DATA:  Low back pain, fever EXAM: CT ABDOMEN AND PELVIS WITH CONTRAST TECHNIQUE: Multidetector CT imaging of the abdomen and pelvis was performed using the standard protocol following bolus administration of intravenous contrast. RADIATION DOSE REDUCTION: This exam was performed according to the departmental dose-optimization program which includes automated exposure control, adjustment of the mA and/or kV according to patient size and/or use of iterative reconstruction technique. CONTRAST:  161m OMNIPAQUE IOHEXOL  300 MG/ML  SOLN COMPARISON:  None Available. FINDINGS: Lower chest: No acute findings Hepatobiliary: Ill-defined low-density lesion in the medial segment of the left hepatic lobe measures 4.1 cm. Appearance is concerning for possible abscess. Gallbladder unremarkable. No biliary  ductal dilatation. Pancreas: No focal abnormality or ductal dilatation. Spleen: No focal abnormality.  Normal size. Adrenals/Urinary Tract: No adrenal abnormality. No focal renal abnormality. No stones or hy

## 2021-10-05 NOTE — Assessment & Plan Note (Addendum)
Temperature curve broke with antibiotics.  Likely secondary to liver abscess. ?

## 2021-10-05 NOTE — Assessment & Plan Note (Addendum)
Continue Synthroid °

## 2021-10-05 NOTE — Progress Notes (Signed)
No charge progress note. ? ?Jonathon Johnston is a 51 y.o. Caucasian male with medical history significant for mild depression, insomnia and GERD, who presented to the emergency room with acute onset of fever that started on Wednesday with a Tmax of 102.9 and headache that started on Tuesday that is frontal and parietal.  His fever has not been responding much to Tylenol and ibuprofen.  He admits to nonproductive cough yesterday without dyspnea or wheezing.  No sore throat or earache or rhinorrhea or nasal congestion.  He has been having diminished urine output but denies any dysuria, urinary frequency or urgency or flank pain.  He denied any nausea or vomiting or diarrhea.  No chest pain or palpitations.  No paresthesias or focal muscle weakness.  He admitted to back pain as well as bilateral flank pain. ? ?ED Course: Upon presentation to the ER, temperature was 102.2 with heart rate 112 and respirate of 24 and pulse symmetry was 98 % initiated on CPAP and later on nasal cannula.  Labs revealed mild anemia and UA was unremarkable.  BMP revealed hyponatremia 130 and hypochloremia of 97 with a creatinine 1.43.  High-sensitivity troponin I was 516 CBC was within normal.  Influenza antigens and COVID-19 PCR came back negative.  Lumbar puncture was attempted by the ER physician but was unfortunately unsuccessful.  ?Imaging: 2 view chest ray showed no acute cardiopulmonary disease. ?CT abdomen and pelvis did show an ill-defined low-density lesion in the left hepatic lobe measuring up to 4.1 centimeter, concerning for possible hepatic abscess. ? ?Patient initially met sepsis criteria with fever, tachycardia and tachypnea most likely secondary to hepatic abscess.  Cultures were drawn and pending. ?GI was consulted-pending ?IR was also consulted-to see the possibility of drain placement. ? ?Patient initially received broad-spectrum antibiotics and IV fluid per sepsis protocol. ?Continued on ceftriaxone and Flagyl. ? ?He  continued to have some back and flank pain when seen today.  Stating that it is better than before.  No nausea, vomiting or diarrhea. ? ?Physical exam was benign. ? ?We will continue current antibiotics and follow-up recommendations from GI and IR. ?

## 2021-10-05 NOTE — Psychosocial Assessment (Signed)
Patient clinically stable post liver aspiration/biopsy per Dr Denna Haggard, tolerated well. Vitals stable pre and post procedure. Received Versed 2 mg along with Fentanyl 50 mcg IV for procedure. Denies complaints post procedure. Report given to Toni Arthurs Rn post procedure/bedside. ?

## 2021-10-05 NOTE — ED Notes (Signed)
Pt out of dept for testing ?

## 2021-10-05 NOTE — Assessment & Plan Note (Addendum)
Creatinine 1.43 on presentation down to 1.07 as yesterday. ?

## 2021-10-05 NOTE — Assessment & Plan Note (Addendum)
Hepatic abscess.  Blood cultures negative.  Liver culture negative.  CRP elevated at 9.7, sedimentation rate 41.  Case discussed with infectious disease and will continue antibiotic therapy with IV Rocephin and Flagyl.  Alpha-fetoprotein low.  Preliminary liver biopsy report showed inflammation, fibrosis and necrosis.  No malignant cells.. ? ?

## 2021-10-05 NOTE — ED Notes (Signed)
MD in room performing lumbar puncture at this time, with this Probation officer. ?

## 2021-10-05 NOTE — Assessment & Plan Note (Addendum)
Continue statin therapy.

## 2021-10-05 NOTE — ED Notes (Signed)
RN notified provider that pt and family would like to speak to them. ?

## 2021-10-05 NOTE — ED Notes (Signed)
Breakfast tray given. °

## 2021-10-05 NOTE — Assessment & Plan Note (Addendum)
Continue Celexa and Wellbutrin XL. ?

## 2021-10-06 DIAGNOSIS — K750 Abscess of liver: Secondary | ICD-10-CM | POA: Diagnosis not present

## 2021-10-06 DIAGNOSIS — E039 Hypothyroidism, unspecified: Secondary | ICD-10-CM

## 2021-10-06 DIAGNOSIS — N179 Acute kidney failure, unspecified: Secondary | ICD-10-CM | POA: Diagnosis not present

## 2021-10-06 DIAGNOSIS — F32A Depression, unspecified: Secondary | ICD-10-CM

## 2021-10-06 DIAGNOSIS — R509 Fever, unspecified: Secondary | ICD-10-CM | POA: Diagnosis not present

## 2021-10-06 DIAGNOSIS — I959 Hypotension, unspecified: Secondary | ICD-10-CM

## 2021-10-06 LAB — URINE CULTURE

## 2021-10-06 LAB — HEPATITIS B CORE ANTIBODY, TOTAL: Hep B Core Total Ab: NONREACTIVE

## 2021-10-06 LAB — HEPATITIS B SURFACE ANTIGEN: Hepatitis B Surface Ag: NONREACTIVE

## 2021-10-06 LAB — HIV ANTIBODY (ROUTINE TESTING W REFLEX): HIV Screen 4th Generation wRfx: NONREACTIVE

## 2021-10-06 MED ORDER — IBUPROFEN 400 MG PO TABS
600.0000 mg | ORAL_TABLET | Freq: Once | ORAL | Status: AC
  Administered 2021-10-06: 600 mg via ORAL
  Filled 2021-10-06: qty 2

## 2021-10-06 NOTE — Progress Notes (Signed)
?  Progress Note ? ? ?Patient: Jonathon Johnston JGG:[id number] DOB: [birth year] DOA: 10/04/2021     1 ?DOS: the patient was seen and examined on 10/06/2021 ?  ? ? ?Assessment and Plan: ?* Hepatic abscess ?Possible hepatic abscess.  So far nothing growing out of the culture.  We will get ID opinion. ?Continue antibiotic therapy with IV Rocephin and Flagyl. ? ? ?Fever ?Temperature curve broke with antibiotics.  Could be secondary to possible hepatic abscess.  Patient could also have had a viral meningitis or encephalitis. ER physician was unable to obtain a lumbar puncture.  We will get infectious disease opinion. ? ?AKI (acute kidney injury) ([place]) ?Creatinine 1.43 on presentation down to 1.09 as of yesterday ? ?Depression ?Continue Celexa and Wellbutrin XL. ? ?Dyslipidemia ?Continue statin therapy. ? ?GERD (gastroesophageal reflux disease) ?Continue PPI therapy. ? ?Hypotension ?Patient states his blood pressure is always on the lower side.  On IV fluid hydration ? ?Hypothyroidism ?Continue Synthroid. ? ? ? ? ?  ? ?Subjective: Patient had a headache from Tuesday last week.  Had fevers at home.  Had body aches.  ER physician attempted a lumbar puncture but could not obtain.  CT scan showed a possible liver abscess which interventional radiology got a biopsy of yesterday. ? ?Physical Exam: ?Vitals:  ? 10/06/21 0448 10/06/21 0746 10/06/21 1150 10/06/21 1609  ?BP: 95/65 104/78 106/76 120/81  ?Pulse: (!) 58 (!) 58 (!) 57 (!) 58  ?Resp: '16 16 18 16  '$ ?Temp: 97.7 ?F (36.5 ?C) (!) 97.1 ?F (36.2 ?C) 98 ?F (36.7 ?C) 97.9 ?F (36.6 ?C)  ?TempSrc: Oral   Oral  ?SpO2: 99% 99% 99% 99%  ?Weight:      ?Height:      ? ?Physical Exam ?HENT:  ?   Head: Normocephalic.  ?   Mouth/Throat:  ?   Pharynx: No oropharyngeal exudate.  ?Eyes:  ?   General: Lids are normal.  ?   Conjunctiva/sclera: Conjunctivae normal.  ?Cardiovascular:  ?   Rate and Rhythm: Normal rate and regular rhythm.  ?   Heart sounds: Normal heart sounds, S1 normal and S2 normal.   ?Pulmonary:  ?   Breath sounds: No decreased breath sounds, wheezing, rhonchi or rales.  ?Abdominal:  ?   Palpations: Abdomen is soft.  ?   Tenderness: There is no abdominal tenderness.  ?Musculoskeletal:  ?   Right lower leg: No swelling.  ?   Left lower leg: No swelling.  ?Skin: ?   General: Skin is warm.  ?   Findings: No rash.  ?Neurological:  ?   Mental Status: He is alert and oriented to person, place, and time.  ?  ?Data Reviewed: ?So far liver abscess culture shows no growth.  Blood cultures negative for 2 days.  Creatinine 1.09.  White blood cell count now normal 6.9.  Hemoglobin 11.8 ? ?Disposition: ?Status is: Inpatient ?Remains inpatient appropriate because: Obtaining infectious disease consultation today and opinion on how to proceed with treatment. ? ?Planned Discharge Destination: Home ? ?Author: ?Loletha Grayer, MD ?10/06/2021 4:33 PM ? ?For on call review [email or web address].  ?

## 2021-10-06 NOTE — Assessment & Plan Note (Addendum)
Improved after hydration. ?

## 2021-10-06 NOTE — Consult Note (Signed)
NAME: Jonathon Johnston  ?DOB: 02-Jun-1970  ?MRN: [id number]  ?Date/Time: 10/06/2021 11:35 AM ? ?REQUESTING PROVIDER: Dr.Wieting ?Subjective:  ?REASON FOR CONSULT: liver abscess ?? ?Jonathon Johnston is a 51 y.o. male with a history of Depression presents with fever of 8 days duration ?Pt works for [employer] from home- [place] with 3 horses and 4 dogs- HE was well until May 2nd- HE then developed headache and fever with chills and sweats. He initially thought it was viral. Martin Majestic  to [facility] twice. On 5/5 was tested for covid and strep throat and was neg. HE went to [facility] on 10/02/21 and influenza was tested and was neg- was diagnosed with viral and sent home- He came ot the ED on 5/8 as he was getting worse- HE had some dry cough that developed Sunday ?HE did not have any chest pain, palpations, blurred vision, rash, joint pain, diarrhea, abdominal pain or constipation. ?HE had dark urine for 2 days and some burning which he attributed to dehydration ?After he came to te ED he got IV fluids which caused him to have pain in both flanks for some time and resolved spontaneously. 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 ?Was a Magazine features editor ? ?In the ED vita;s BP 116/74, HR 71, Temp 99.7 ( T max 102.2) ?WBC 11.4, HB 12.2, PLT 423 and cr 1.34 ?Blood culture sent ?CXR no acute finding ?CT abdomen showed a 4 cm low density left hepatic lobe lesion concerning for hepatic abscess ?Past Medical History:   ?Diagnosis Date  ? GERD (gastroesophageal reflux disease)   ? Depression ?H/o pleuritis ?Past Surgical History:  ?Procedure Laterality Date  ? APPENDECTOMY    ? IR US GUIDE BX ASP/DRAIN  10/05/2021  ?  ?Social History  ? ?Socioeconomic History  ? Marital status: Married  ?  Spouse name: Not on file  ? Number of children: Not on file  ? Years of education: Not on file  ? Highest education level: Not on file  ?Occupational History  ? Not on file  ?Tobacco Use  ? Smoking status: Never  ? Smokeless tobacco: Never  ?Substance and Sexual Activity  ? Alcohol use: No  ?  Comment: occasionally   ? Drug use: No  ? Sexual activity: Not on file  ?Other Topics Concern  ? Not on file  ?Social History Narrative  ? Not on file  ? ?Social Determinants of Health  ? ?Financial Resource Strain: Not on file  ?Food Insecurity: Not on file  ?Transportation Needs: Not on file  ?Physical Activity: Not on file  ?Stress: Not on file  ?Social Connections: Not on file  ?Intimate Partner Violence: Not on file  ?  ?History reviewed. No pertinent family history. ?No Known Allergies ?I? ?Current Facility-Administered Medications  ?Medication Dose Route Frequency Provider Last Rate Last Admin  ? 0.9 %  sodium chloride infusion   Intravenous Continuous Mansy, Arvella Merles, MD 100 mL/hr at  10/06/21 0607 New Bag at 10/06/21 0607  ? acetaminophen (TYLENOL) tablet 650 mg  650 mg Oral Q6H PRN Mansy, Jan A, MD   650 mg at 10/05/21 2047  ? Or  ? acetaminophen (TYLENOL) suppository 650 mg  650 mg Rectal Q6H PRN Mansy, Jan A, MD      ? buPROPion (WELLBUTRIN XL) 24 hr tablet 300 mg  300 mg Oral q morning Mansy, Jan A, MD   300 mg at 10/06/21 0945  ? cefTRIAXone (ROCEPHIN) 2 g in sodium chloride 0.9 % 100 mL IVPB  2 g Intravenous Q24H Rauer, Samantha O, RPH 200 mL/hr at 10/06/21 1050 2 g at 10/06/21 1050  ? citalopram (CELEXA) tablet 20 mg  20 mg Oral QHS Mansy, Jan A, MD   20 mg at 10/05/21 2047  ? enoxaparin (LOVENOX) injection 40 mg  40 mg Subcutaneous Q24H Mansy,  Jan A, MD   40 mg at 10/06/21 0945  ? levothyroxine (SYNTHROID) tablet 50 mcg  50 mcg Oral Q0600 Mansy, Jan A, MD   50 mcg at 10/06/21 0606  ? lidocaine (PF) (XYLOCAINE) 1 % injection 5 mL  5 mL Other Once Nance Pear, MD      ? magnesium hydroxide (MILK OF MAGNESIA) suspension 30 mL  30 mL Oral Daily PRN Mansy, Jan A, MD      ? metroNIDAZOLE (FLAGYL) IVPB 500 mg  500 mg Intravenous Q8H Mansy, Jan A, MD 100 mL/hr at 10/06/21 0948 500 mg at 10/06/21 0948  ? multivitamin with minerals tablet 1 tablet  1 tablet Oral Daily Mansy, Jan A, MD   1 tablet at 10/06/21 0945  ? ondansetron (ZOFRAN) tablet 4 mg  4 mg Oral Q6H PRN Mansy, Jan A, MD      ? Or  ? ondansetron [facility] injection 4 mg  4 mg Intravenous Q6H PRN Mansy, Jan A, MD      ? pantoprazole (PROTONIX) EC tablet 40 mg  40 mg Oral Daily Mansy, Jan A, MD   40 mg at 10/06/21 0945  ? temazepam (RESTORIL) capsule 15 mg  15 mg Oral Daily PRN Mansy, Jan A, MD   15 mg at 10/05/21 2049  ? traZODone (DESYREL) tablet 25 mg  25 mg Oral QHS PRN Mansy, Jan A, MD   25 mg at 10/05/21 2049  ? zolpidem (AMBIEN) tablet 10 mg  10 mg Oral QHS Mansy, Arvella Merles, MD      ?  ? ?Abtx:  ?Anti-infectives (From admission, onward)  ? ? Start     Dose/Rate Route Frequency Ordered Stop  ? 10/06/21 1000  cefTRIAXone (ROCEPHIN) 2 g in sodium chloride 0.9 % 100 mL IVPB       ? 2 g ?200 mL/hr over 30 Minutes Intravenous Every 24 hours 10/05/21 1326    ? 10/05/21 1000  cefTRIAXone (ROCEPHIN) 2 g in sodium chloride 0.9 % 100 mL IVPB  Status:  Discontinued       ? 2 g ?200 mL/hr over 30 Minutes Intravenous Every 12 hours 10/05/21 0147 10/05/21 1326  ? 10/05/21 0200  metroNIDAZOLE (FLAGYL) IVPB 500 mg       ? 500 mg ?100 mL/hr over 60 Minutes Intravenous Every 8 hours 10/05/21 0147    ? 10/04/21 2130  vancomycin (VANCOREADY) IVPB 2000 mg/400 mL       ? 2,000 mg ?200 mL/hr over 120 Minutes Intravenous  Once 10/04/21 2110 10/05/21 0118  ? 10/04/21 2115  cefTRIAXone (ROCEPHIN) 2 g in sodium chloride 0.9 %  100 mL IVPB       ? 2 g ?200 mL/hr over 30 Minutes Intravenous  Once 10/04/21 2104 10/04/21 2243  ? 10/04/21 2115  vancomycin (VANCOCIN) IVPB 1000 mg/200 mL premix  Status:  Discontinued       ? 1,000 mg ?200 mL/hr over 60 Minutes Intravenous  Once 10/04/21 2104 10/04/21 2109  ? ?  ? ? ?REVIEW OF SYSTEMS:  ?Const:  fever,  chills, sweats, negative weight loss ?Eyes: negative diplopia or visual changes, negative eye pain ?ENT: negative coryza, negative sore throat ?Resp: + cough, no hemoptysis, dyspnea ?Cards: negative for chest pain, palpitations, lower extremity edema ?GU: negative for frequency, +dysuria and no hematuria ?GI: Negative for abdominal pain, diarrhea, bleeding, constipation ?Skin: negative for rash and pruritus ?Heme: negative for easy bruising and gum/nose bleeding ?MS: negative for myalgias, arthralgias, +back pain and muscle weakness ?Neurolo:++ headaches, no dizziness, vertigo, memory problems  ?Psych: negative for feelings of anxiety, depression  ?Endocrine: negative for thyroid, diabetes ?Allergy/Immunology- negative for any medication or food allergies ?? ?Objective:  ?VITALS:  ?BP 104/78 (BP Location: Left Arm)   Pulse (!) 58   Temp (!) 97.1 ?F (36.2 ?C)   Resp 16   Ht 6' (1.829 m)   Wt 99.8 kg   SpO2 99%   BMI 29.84 kg/m?  ?LDA ?none ?PHYSICAL EXAM:  ?General: Alert, cooperative, no distress, appears stated age.  ?Head: Normocephalic, without obvious abnormality, atraumatic. ?Eyes: Conjunctivae clear, anicteric sclerae. Pupils are equal ?ENT Nares normal. No drainage or sinus tenderness. ?Lips, mucosa, and tongue normal. No Thrush ?Neck: Supple, symmetrical, no adenopathy, thyroid: non tender ?no carotid bruit and no JVD. ?Back: No CVA tenderness. ?Lungs: Clear to auscultation bilaterally. No Wheezing or Rhonchi. No rales. ?Heart: Regular rate and rhythm, no murmur, rub or gallop. ?Abdomen: Soft, non-tender,not distended. Bowel sounds normal. No masses ?Extremities: atraumatic, no  cyanosis. No edema. No clubbing ?Skin: No rashes or lesions. Or bruising ?Lymph: Cervical, supraclavicular normal. ?Neurologic: Grossly non-focal ?Pertinent Labs ?Lab Results ?CBC ?   ?Component Value Date/Time  ?

## 2021-10-06 NOTE — Plan of Care (Signed)

## 2021-10-07 ENCOUNTER — Inpatient Hospital Stay
Admit: 2021-10-07 | Discharge: 2021-10-07 | Disposition: A | Discharge status: Home / Self Care | Payer: 59 | Attending: Internal Medicine | Admitting: Internal Medicine

## 2021-10-07 DIAGNOSIS — R509 Fever, unspecified: Secondary | ICD-10-CM | POA: Diagnosis not present

## 2021-10-07 DIAGNOSIS — N179 Acute kidney failure, unspecified: Secondary | ICD-10-CM | POA: Diagnosis not present

## 2021-10-07 DIAGNOSIS — K750 Abscess of liver: Secondary | ICD-10-CM | POA: Diagnosis not present

## 2021-10-07 DIAGNOSIS — F32A Depression, unspecified: Secondary | ICD-10-CM | POA: Diagnosis not present

## 2021-10-07 LAB — ECHOCARDIOGRAM COMPLETE
AR max vel: 3.29 cm2
AV Area VTI: 3.18 cm2
AV Area mean vel: 2.86 cm2
AV Mean grad: 2 mmHg
AV Peak grad: 3.2 mmHg
Ao pk vel: 0.89 m/s
Area-P 1/2: 2.5 cm2
Height: 72 in
MV VTI: 2.86 cm2
S' Lateral: 3.3 cm
Weight: 3520 oz

## 2021-10-07 LAB — CBC
HCT: 34 % — ABNORMAL LOW (ref 39.0–52.0)
Hemoglobin: 11 g/dL — ABNORMAL LOW (ref 13.0–17.0)
MCH: 29.3 pg (ref 26.0–34.0)
MCHC: 32.4 g/dL (ref 30.0–36.0)
MCV: 90.4 fL (ref 80.0–100.0)
Platelets: 436 10*3/uL — ABNORMAL HIGH (ref 150–400)
RBC: 3.76 MIL/uL — ABNORMAL LOW (ref 4.22–5.81)
RDW: 13.2 % (ref 11.5–15.5)
WBC: 6.3 10*3/uL (ref 4.0–10.5)
nRBC: 0 % (ref 0.0–0.2)

## 2021-10-07 LAB — C-REACTIVE PROTEIN: CRP: 9.7 mg/dL — ABNORMAL HIGH (ref <1.0)

## 2021-10-07 LAB — BASIC METABOLIC PANEL
Anion gap: 3 — ABNORMAL LOW (ref 5–15)
BUN: 13 mg/dL (ref 6–20)
CO2: 27 mmol/L (ref 22–32)
Calcium: 8.7 mg/dL — ABNORMAL LOW (ref 8.9–10.3)
Chloride: 110 mmol/L (ref 98–111)
Creatinine, Ser: 1.07 mg/dL (ref 0.61–1.24)
GFR, Estimated: >60 mL/min (ref >60)
Glucose, Bld: 100 mg/dL — ABNORMAL HIGH (ref 70–99)
Potassium: 4.2 mmol/L (ref 3.5–5.1)
Sodium: 140 mmol/L (ref 135–145)

## 2021-10-07 LAB — HCV AB W REFLEX TO QUANT PCR: HCV Ab: NONREACTIVE

## 2021-10-07 LAB — HCV INTERPRETATION

## 2021-10-07 LAB — HEPATITIS B SURFACE ANTIBODY, QUANTITATIVE: Hep B S AB Quant (Post): <3.1 m[IU]/mL — ABNORMAL LOW (ref >9.9)

## 2021-10-07 LAB — AFP TUMOR MARKER: AFP, Serum, Tumor Marker: <1.8 ng/mL (ref 0.0–8.4)

## 2021-10-07 LAB — RPR: RPR Ser Ql: NONREACTIVE

## 2021-10-07 LAB — SEDIMENTATION RATE: Sed Rate: 41 mm/hr — ABNORMAL HIGH (ref 0–20)

## 2021-10-07 MED ORDER — ALPRAZOLAM 0.25 MG PO TABS
0.2500 mg | ORAL_TABLET | Freq: Three times a day (TID) | ORAL | Status: DC | PRN
  Administered 2021-10-07 – 2021-10-08 (×2): 0.25 mg via ORAL
  Filled 2021-10-07 (×2): qty 1

## 2021-10-07 NOTE — Progress Notes (Signed)
Case discussed with infectious disease specialist and pathologist.  No cancer cells seen on biopsy they did not see fibrosis, inflammation and necrosis.  Plan will be PICC line and IV antibiotics and follow-up as outpatient.  We will order PICC line tomorrow morning.  We will consult transitional care team for need for IV antibiotics at home. ? ?The patient was relieved that this was not cancer.  Explained the plan for IV antibiotics at home. ? ?Dr. Loletha Grayer ?

## 2021-10-07 NOTE — Progress Notes (Signed)
*  PRELIMINARY RESULTS* ?Echocardiogram ?2D Echocardiogram has been performed. ? ?Anoop Hemmer, Sonia Side ?10/07/2021, 10:52 AM ?

## 2021-10-07 NOTE — Progress Notes (Signed)
?  Progress Note ? ? ?Patient: Jonathon Johnston OHY:[id number] DOB: 02/02/1971 DOA: 10/04/2021     2 ?DOS: the patient was seen and examined on 10/07/2021 ?  ? ?Assessment and Plan: ?* Hepatic abscess ?Possible hepatic abscess.  Blood cultures negative.  Liver culture negative.  CRP elevated at 9.7, sedimentation rate 41.  Case discussed with infectious disease and will continue antibiotic therapy with IV Rocephin and Flagyl.  Alpha-fetoprotein low.  Awaiting biopsy report. ? ? ?Fever ?Temperature curve broke with antibiotics.  Could be secondary to possible hepatic abscess. ? ?AKI (acute kidney injury) ([place]) ?Creatinine 1.43 on presentation down to 1.07 as of today ? ?Depression ?Continue Celexa and Wellbutrin XL. ? ?Dyslipidemia ?Continue statin therapy. ? ?GERD (gastroesophageal reflux disease) ?Continue PPI therapy. ? ?Hypotension ?Improved after hydration. ? ?Hypothyroidism ?Continue Synthroid. ? ? ? ? ?  ? ?Subjective: Patient feeling better.  Offers complaints of a little headache.  Feels tired and rundown.  Feels anxious about being here and what is going on. ? ?Physical Exam: ?Vitals:  ? 10/06/21 1609 10/06/21 2101 10/07/21 0430 10/07/21 0821  ?BP: 120/81 121/79 110/68 119/85  ?Pulse: (!) 58 (!) 58 (!) 56 60  ?Resp: '16 16 18   '$ ?Temp: 97.9 ?F (36.6 ?C) 97.7 ?F (36.5 ?C) (!) 97.3 ?F (36.3 ?C) (!) 97.3 ?F (36.3 ?C)  ?TempSrc: Oral  Oral Oral  ?SpO2: 99% 98% 99% 98%  ?Weight:      ?Height:      ? ?Physical Exam ?HENT:  ?   Head: Normocephalic.  ?   Mouth/Throat:  ?   Pharynx: No oropharyngeal exudate.  ?Eyes:  ?   General: Lids are normal.  ?   Conjunctiva/sclera: Conjunctivae normal.  ?Cardiovascular:  ?   Rate and Rhythm: Normal rate and regular rhythm.  ?   Heart sounds: Normal heart sounds, S1 normal and S2 normal.  ?Pulmonary:  ?   Breath sounds: No decreased breath sounds, wheezing, rhonchi or rales.  ?Abdominal:  ?   Palpations: Abdomen is soft.  ?   Tenderness: There is no abdominal tenderness.   ?Musculoskeletal:  ?   Right lower leg: No swelling.  ?   Left lower leg: No swelling.  ?Skin: ?   General: Skin is warm.  ?   Findings: No rash.  ?Neurological:  ?   Mental Status: He is alert and oriented to person, place, and time.  ?  ?Data Reviewed: ?White blood cell count 6.3, platelet count 436, hemoglobin 11.0, creatinine 1.07 ? ?Family Communication: Spoke with girlfriend at bedside ? ?Disposition: ?Status is: Inpatient ?Remains inpatient appropriate because: Currently requiring IV antibiotics for possible liver abscess. ? ?Planned Discharge Destination: Home ? ?Author: ?Loletha Grayer, MD ?10/07/2021 3:46 PM ? ?For on call review [email or web address].  ?

## 2021-10-07 NOTE — Progress Notes (Signed)
Mobility Specialist - Progress Note ? ? ? 10/07/21 1200  ?Mobility  ?Activity Ambulated independently in hallway  ?Level of Assistance Independent  ?Assistive Device None  ?Distance Ambulated (ft) 320 ft  ?Activity Response Tolerated well  ?$Mobility charge 1 Mobility  ? ? ?Pt standing at sink upon arrival completing ADLs on RA with wife at bedside. Ambulates indep voicing no complaints and returns to room with needs in reach. ? ?Jonathon Johnston ?Mobility Specialist ?10/07/21, 12:06 PM ? ? ? ? ?

## 2021-10-07 NOTE — Progress Notes (Signed)
ID ?Pt has no fever ?Stressed because of worry  ?Headache  ?I told him that he has no cancer on the biopsy and he was happy and relaxed ? ?O/e looks well ? ?Patient Vitals for the past 24 hrs: ? BP Temp Temp src Pulse Resp SpO2  ?10/07/21 2001 116/83 98.2 ?F (36.8 ?C) Oral 73 18 100 %  ?10/07/21 1608 119/88 98.3 ?F (36.8 ?C) Oral 70 -- 97 %  ?10/07/21 0821 119/85 (!) 97.3 ?F (36.3 ?C) Oral 60 -- 98 %  ?10/07/21 0430 110/68 (!) 97.3 ?F (36.3 ?C) Oral (!) 56 18 99 %  ? Chest CTA ?Hss1s2 ?Abd soft ?CNS non focal ?Skin no rash ? ?Labs ? ?  Latest Ref Rng & Units 10/07/2021  ?  5:38 AM 10/05/2021  ?  4:42 AM 10/04/2021  ?  8:56 PM  ?CBC  ?WBC 4.0 - 10.5 K/uL 6.3   6.9   11.4    ?Hemoglobin 13.0 - 17.0 g/dL 11.0   11.8   12.2    ?Hematocrit 39.0 - 52.0 % 34.0   35.6   36.1    ?Platelets 150 - 400 K/uL 436   364   423    ?  ? ?  Latest Ref Rng & Units 10/07/2021  ?  5:38 AM 10/05/2021  ?  4:42 AM 10/04/2021  ?  8:56 PM  ?CMP  ?Glucose 70 - 99 mg/dL 100   169   111    ?BUN 6 - 20 mg/dL '13   15   15    '$ ?Creatinine 0.61 - 1.24 mg/dL 1.07   1.09   1.34    ?Sodium 135 - 145 mmol/L 140   137   132    ?Potassium 3.5 - 5.1 mmol/L 4.2   4.1   3.7    ?Chloride 98 - 111 mmol/L 110   109   102    ?CO2 22 - 32 mmol/L '27   21   20    '$ ?Calcium 8.9 - 10.3 mg/dL 8.7   8.5   9.0    ?Total Protein 6.5 - 8.1 g/dL   7.5    ?Total Bilirubin 0.3 - 1.2 mg/dL   0.6    ?Alkaline Phos 38 - 126 U/L   97    ?AST 15 - 41 U/L   40    ?ALT 0 - 44 U/L   59    ?  ? ?Micro ?BC- pending ?Liver biopsy culture pending ? ?Impression/recommendation ?Pt presenting with fever, headache and found to have a lesion on liver biopsy ?IR tried to aspirate and it was solid so a core biopsy was taken and sent for pathology and culture ?Pt on Iv ceftriaxone and IV flagyl ?No fever for the past 24 hr ?Stressed because of liver ?I spoke to pathologist and preliminary reading is not malignancy- necrosis, inflammation and fibrosis seen- more of infection ?So far cultures neg ?'Will send  a few more tests including brucella, bartonella, fungal antibodies, quant gold, and add fungal and afb culture to core biopsy ? ?Discussed with patient that it is not malignancy ?As he is improving on current regimen ?Plan would be to place PICC and send him on ceftriaxone + flagyl for 4 weeks. IF culture results then can adjust or continue same antibiotics ?Discussed with hospitalist ? ?

## 2021-10-08 ENCOUNTER — Inpatient Hospital Stay: Payer: Self-pay

## 2021-10-08 LAB — ANA COMPREHENSIVE PANEL

## 2021-10-08 LAB — Q FEVER ANTIBODIES, IGG
Q Fever Phase I: NEGATIVE
Q Fever Phase II: NEGATIVE

## 2021-10-08 MED ORDER — METRONIDAZOLE 500 MG PO TABS: 500.0000 mg | ORAL_TABLET | Freq: Two times a day (BID) | ORAL | Status: DC

## 2021-10-08 MED ORDER — SODIUM CHLORIDE 0.9% FLUSH: 10.0000 mL | Freq: Two times a day (BID) | INTRAVENOUS | Status: DC

## 2021-10-08 MED ORDER — METRONIDAZOLE 500 MG PO TABS: 500.0000 mg | ORAL_TABLET | Freq: Two times a day (BID) | ORAL | 0 refills | Status: AC

## 2021-10-08 MED ORDER — CHLORHEXIDINE GLUCONATE CLOTH 2 % EX PADS: 6.0000 | MEDICATED_PAD | Freq: Every day | CUTANEOUS | Status: DC

## 2021-10-08 MED ORDER — SODIUM CHLORIDE 0.9% FLUSH: 10.0000 mL | Freq: Two times a day (BID) | INTRAVENOUS | 0 refills | Status: AC

## 2021-10-08 MED ORDER — CEFTRIAXONE IV (FOR PTA / DISCHARGE USE ONLY): 2.0000 g | INTRAVENOUS | 0 refills | Status: AC

## 2021-10-08 MED ORDER — ONDANSETRON HCL 4 MG PO TABS: 4.0000 mg | ORAL_TABLET | Freq: Four times a day (QID) | ORAL | 0 refills | Status: AC | PRN

## 2021-10-08 MED ORDER — SODIUM CHLORIDE 0.9% FLUSH: 10.0000 mL | INTRAVENOUS | Status: DC | PRN

## 2021-10-08 NOTE — TOC Transition Note (Signed)
Transition of Care (TOC) - CM/SW Discharge Note ? ? ?Patient Details  ?Name: JAVANNI MARING ?MRN: [id number] ?Date of Birth: [birth year] ? ?Transition of Care (TOC) CM/SW Contact:  ?Shelbie Hutching, RN ?Phone Number: ?10/08/2021, 3:56 PM ? ? ?Clinical Narrative:    ?Patient to discharge home today- [facility] with [facility] to complete teaching with patient at bedside.  [facility] has been arranged for [facility] RN.   ? ? ?Final next level of care: [place] ?Barriers to Discharge: Barriers Resolved ? ? ?Patient Goals and CMS Choice ?Patient states their goals for this hospitalization and ongoing recovery are:: to get back home ?CMS Medicare.gov Compare Post Acute Care list provided to:: Patient ?Choice offered to / list presented to : Patient ? ?Discharge Placement ?  ?           ?  ?  ?  ?  ? ?Discharge Plan and Services ?  ?Discharge Planning Services: CM Consult ?Post Acute Care Choice: Home Health          ?DME Arranged: N/A ?DME Agency: NA ?  ?  ?  ?HH Arranged: RN, IV Antibiotics ?[place] Agency: [facility] ([facility]) ?Date HH Agency Contacted: 10/08/21 ?Time [place]: [phone] ?Representative spoke with at [place]: Carolynn Sayers ? ?Social Determinants of Health (SDOH) Interventions ?  ? ? ?Readmission Risk Interventions ?   ? View : No data to display.  ?  ?  ?  ? ? ? ? ? ?

## 2021-10-08 NOTE — Progress Notes (Signed)
PHARMACY CONSULT NOTE FOR: ? ?OUTPATIENT  PARENTERAL ANTIBIOTIC THERAPY (OPAT) ? ?Indication: hepatic abscess ?Regimen: ceftriaxone 2gm IV q24h ?End date: 11/02/2021 ? ?Also PO antibiotic: ?Metronidazole '500mg'$  po BID ? ?IV antibiotic discharge orders are pended. ?To discharging provider:  please sign these orders via discharge navigator,  ?Select New Orders & click on the button choice - Manage This Unsigned Work.  ?  ? ?Thank you for allowing pharmacy to be a part of this patient's care. ? ?Doreene Eland, PharmD, BCPS, BCIDP ?Work Cell: [phone] ?10/08/2021 12:50 PM ? ? ? ?

## 2021-10-08 NOTE — Treatment Plan (Signed)
Diagnosis: ?Hepatic abscess ?Baseline Creatinine <1 ? ? ?No Known Allergies ? ?OPAT Orders ?Discharge antibiotics: ?Ceftriaxone 2 grams IV every 24 hours ?Flagyl '500mg'$  PO Q 12  ?Duration: ?4 weeks ?End Date: ?11/02/21 ?May need more ? ?[facility] Per Protocol: ? ?Labs weekly while on IV antibiotics: ?_X_ CBC with differential ? ?_X_ CMP ?_X_ CRP ?_ ? ?_X_ Please pull PIC at completion of IV antibiotics ? ? ?Fax weekly labs to [phone] ? ?Clinic Follow Up Appt:10/26/21 at 8.45 AM ? ? ?Call [phone] with any critical values or questions ? ?  ?

## 2021-10-08 NOTE — Progress Notes (Signed)
ID ?Pt is being discharged ?O/e looks well ? ?Patient Vitals for the past 24 hrs: ? BP Temp Temp src Pulse Resp SpO2  ?10/08/21 0847 104/76 97.8 ?F (36.6 ?C) Oral 81 -- 97 %  ?10/07/21 2001 116/83 98.2 ?F (36.8 ?C) Oral 73 18 100 %  ?10/07/21 1608 119/88 98.3 ?F (36.8 ?C) Oral 70 -- 97 %  ?Labs ? ?  Latest Ref Rng & Units 10/07/2021  ?  5:38 AM 10/05/2021  ?  4:42 AM 10/04/2021  ?  8:56 PM  ?CBC  ?WBC 4.0 - 10.5 K/uL 6.3   6.9   11.4    ?Hemoglobin 13.0 - 17.0 g/dL 11.0   11.8   12.2    ?Hematocrit 39.0 - 52.0 % 34.0   35.6   36.1    ?Platelets 150 - 400 K/uL 436   364   423    ?  ? ?  Latest Ref Rng & Units 10/07/2021  ?  5:38 AM 10/05/2021  ?  4:42 AM 10/04/2021  ?  8:56 PM  ?CMP  ?Glucose 70 - 99 mg/dL 100   169   111    ?BUN 6 - 20 mg/dL '13   15   15    '$ ?Creatinine 0.61 - 1.24 mg/dL 1.07   1.09   1.34    ?Sodium 135 - 145 mmol/L 140   137   132    ?Potassium 3.5 - 5.1 mmol/L 4.2   4.1   3.7    ?Chloride 98 - 111 mmol/L 110   109   102    ?CO2 22 - 32 mmol/L '27   21   20    '$ ?Calcium 8.9 - 10.3 mg/dL 8.7   8.5   9.0    ?Total Protein 6.5 - 8.1 g/dL   7.5    ?Total Bilirubin 0.3 - 1.2 mg/dL   0.6    ?Alkaline Phos 38 - 126 U/L   97    ?AST 15 - 41 U/L   40    ?ALT 0 - 44 U/L   59    ?  ? ?Micro ?BC- pending ?Liver biopsy culture pending ? ?Impression/recommendation ?Pt presenting with fever, headache and found to have a lesion on liver biopsy ?IR tried to aspirate and it was solid so a core biopsy was taken and sent for pathology and culture ?Pt on Iv ceftriaxone and IV flagyl ?No fever for the past 24 hr ?Stressed because of liver ?I spoke to pathologist and preliminary reading is not malignancy- necrosis, inflammation and fibrosis seen- more of infection ?So far cultures neg ?'Sent  brucella, bartonella, fungal antibodies, quant gold, and  fungal and afb culture to  ? ? ?As he is improving on current regimen ?Plan would be to place PICC and send him on ceftriaxone + flagyl for 4 weeks. IF culture results then can adjust or  continue same antibiotics ?Will follow as OP ?Discussed the management with patient and care team ? ?

## 2021-10-08 NOTE — Progress Notes (Signed)
Peripherally Inserted Central Catheter Placement ? ?The IV Nurse has discussed with the patient and/or persons authorized to consent for the patient, the purpose of this procedure and the potential benefits and risks involved with this procedure.  The benefits include less needle sticks, lab draws from the catheter, and the patient may be discharged home with the catheter. Risks include, but not limited to, infection, bleeding, blood clot (thrombus formation), and puncture of an artery; nerve damage and irregular heartbeat and possibility to perform a PICC exchange if needed/ordered by physician.  Alternatives to this procedure were also discussed.  Bard Power PICC patient education guide, fact sheet on infection prevention and patient information card has been provided to patient /or left at bedside.   ? ?PICC Placement Documentation  ?PICC Single Lumen 10/08/21 Right Brachial 42 cm 1 cm (Active)  ?Indication for Insertion or Continuance of Line Home intravenous therapies (PICC only) 10/08/21 1504  ?Exposed Catheter (cm) 1 cm 10/08/21 1504  ?Site Assessment Clean, Dry, Intact 10/08/21 1504  ?Line Status Flushed;Saline locked;Blood return noted 10/08/21 1504  ?Dressing Type Transparent;Securing device 10/08/21 1504  ?Dressing Status Antimicrobial disc in place 10/08/21 1504  ?Safety Lock Not Applicable 90/30/09 2330  ?Line Care Connections checked and tightened 10/08/21 1504  ?Line Adjustment (NICU/IV Team Only) No 10/08/21 1504  ?Dressing Intervention New dressing 10/08/21 1504  ?Dressing Change Due 10/15/21 10/08/21 1504  ? ? ? ? ? ?Mickel Baas  Tomislav Micale ?10/08/2021, 3:06 PM ? ?

## 2021-10-08 NOTE — Discharge Summary (Signed)
?Physician Discharge Summary ?  ?Patient: Jonathon Johnston MRN: [id number] DOB: January 10, 1971  ?Admit date:     10/04/2021  ?Discharge date: 10/08/21  ?Discharge Physician: Loletha Grayer  ? ?PCP: Marda Stalker, PA-C  ? ?Recommendations at discharge:  ? ?Follow-up PCP 5 days ?Follow-up Dr. Steva Ready 2 to 3 weeks. ? ?Discharge Diagnoses: ?Principal Problem: ?  Hepatic abscess ?Active Problems: ?  Fever ?  AKI (acute kidney injury) ([place]) ?  Depression ?  Dyslipidemia ?  GERD (gastroesophageal reflux disease) ?  Hypothyroidism ?  Hypotension ? ? ? ?Hospital Course: ?The patient was admitted to the hospital on 10/04/2021 and discharged on 10/08/2021.  He came in initially with fever and headache.  The ER physician attempted a lumbar puncture but was not successful.  The patient had a CT scan of the abdomen that showed an ill-defined low-density lesion in the left hepatic lobe measuring 4.1 cm concerning for hepatic abscess.  On 10/05/2021 interventional radiology took the patient for a biopsy of the liver lesion and they were unable to aspirate any fluid.  No organisms growing out of the culture.  Preliminary report from biopsy did not show any cancerous cells did show fibrosis inflammation and some necrosis.  The patient's fever broke with Rocephin and Flagyl.  Infectious disease specialist Dr. Steva Ready was consulted and decided to continue IV Rocephin and switch over to p.o. Flagyl upon discharge.  PICC line placed on 10/08/2021 and will be continued through 11/02/2021.  Home health for PICC line care and teaching on how to give IV Rocephin.  Rocephin and Flagyl to be continued through 11/02/2021.  Weekly labs with home health with results to Dr. Steva Ready. ? ?Assessment and Plan: ?* Hepatic abscess ?Hepatic abscess.  Blood cultures negative.  Liver culture negative.  CRP elevated at 9.7, sedimentation rate 41.  Case discussed with infectious disease and will continue antibiotic therapy with IV Rocephin and Flagyl.   Alpha-fetoprotein low.  Preliminary liver biopsy report showed inflammation, fibrosis and necrosis.  No malignant cells.. ? ? ?Fever ?Temperature curve broke with antibiotics.  Likely secondary to liver abscess. ? ?AKI (acute kidney injury) ([place]) ?Creatinine 1.43 on presentation down to 1.07 as yesterday. ? ?Depression ?Continue Celexa and Wellbutrin XL. ? ?Dyslipidemia ?Continue statin therapy. ? ?GERD (gastroesophageal reflux disease) ?Continue PPI therapy. ? ?Hypotension ?Improved after hydration. ? ?Hypothyroidism ?Continue Synthroid. ? ? ? ? ?  ? ? ?Consultants: Interventional radiology, gastroenterology, infectious disease ?Procedures performed: Liver biopsy and culture ?Disposition: Home health ?Diet recommendation:  ?Regular diet ?DISCHARGE MEDICATION: ?Allergies as of 10/08/2021   ?No Known Allergies ?  ? ?  ?Medication List  ?  ? ?TAKE these medications   ? ?Belsomra 10 MG Tabs ?Generic drug: Suvorexant ?Take 2 tablets by mouth at bedtime. ?  ?buPROPion 300 MG 24 hr tablet ?Commonly known as: WELLBUTRIN XL ?Take 300 mg by mouth every morning. ?  ?cefTRIAXone  IVPB ?Commonly known as: ROCEPHIN ?Inject 2 g into the vein daily for 24 days. Indication:  Hepatic abscess ?First Dose: Yes ?Last Day of Therapy:  11/02/2021 ?Labs - Once weekly:  CBC/D, CMP, and CRP ?Please pull PIC at completion of IV antibiotics ?Fax weekly labs to [phone] ?Method of administration: IV Push ?Method of administration may be changed at the discretion of home infusion pharmacist based upon assessment of the patient and/or caregiver's ability to self-administer the medication ordered. ?Start taking on: Oct 09, 2021 ?  ?citalopram 20 MG tablet ?Commonly known as: CELEXA ?Take 20 mg  by mouth at bedtime. ?  ?eszopiclone 1 MG Tabs tablet ?Commonly known as: LUNESTA ?Take 1 mg by mouth at bedtime. ?  ?levothyroxine 50 MCG tablet ?Commonly known as: SYNTHROID ?Take 50 mcg by mouth every morning. ?  ?metroNIDAZOLE 500 MG tablet ?Commonly  known as: Flagyl ?Take 1 tablet (500 mg total) by mouth 2 (two) times daily for 25 days. ?  ?omeprazole 20 MG capsule ?Commonly known as: PRILOSEC ?1 capsule 30 minutes before morning meal ?  ?ondansetron 4 MG tablet ?Commonly known as: ZOFRAN ?Take 1 tablet (4 mg total) by mouth every 6 (six) hours as needed for nausea. ?  ?One Daily tablet ?Take 1 tablet by mouth daily. ?  ?rosuvastatin 10 MG tablet ?Commonly known as: CRESTOR ?Take 10 mg by mouth daily. ?  ?sodium chloride flush 0.9 % Soln ?Commonly known as: NS ?10 mLs by Intracatheter route every 12 (twelve) hours for 25 days. ?  ?temazepam 15 MG capsule ?Commonly known as: RESTORIL ?Take 15 mg by mouth daily as needed. ?  ? ?  ? ?  ?  ? ? ?  ?Discharge Care Instructions  ?(From admission, onward)  ?  ? ? ?  ? ?  Start     Ordered  ? 10/08/21 0000  Change dressing on IV access line weekly and PRN  (Home infusion instructions - [facility] )       ? 10/08/21 1401  ? ?  ?  ? ?  ? ? Follow-up Information   ? ? Marda Stalker, PA-C Follow up in 5 day(s).   ?Specialty: Family Medicine ?Contact information: ?[place] [place] ?[place] [address] ?[phone] ? ? ?  ?  ? ? Tsosie Billing, MD Follow up in 2 week(s).   ?Specialty: Infectious Diseases ?Contact information: ?[place] [address] ?[phone] ? ? ?  ?  ? ?  ?  ? ?  ? ?Discharge Exam: ?Filed Weights  ? 10/04/21 2057  ?Weight: 99.8 kg  ? ?Physical Exam ?HENT:  ?   Head: Normocephalic.  ?   Mouth/Throat:  ?   Pharynx: No oropharyngeal exudate.  ?Eyes:  ?   General: Lids are normal.  ?   Conjunctiva/sclera: Conjunctivae normal.  ?Cardiovascular:  ?   Rate and Rhythm: Normal rate and regular rhythm.  ?   Heart sounds: Normal heart sounds, S1 normal and S2 normal.  ?Pulmonary:  ?   Breath sounds: No decreased breath sounds, wheezing, rhonchi or rales.  ?Abdominal:  ?   Palpations: Abdomen is soft.  ?   Tenderness: There is no abdominal tenderness.   ?Musculoskeletal:  ?   Right lower leg: No swelling.  ?   Left lower leg: No swelling.  ?Skin: ?   General: Skin is warm.  ?   Findings: No rash.  ?Neurological:  ?   Mental Status: He is alert and oriented to person, place, and time.  ?  ? ?Condition at discharge: stable ? ?The results of significant diagnostics from this hospitalization (including imaging, microbiology, ancillary and laboratory) are listed below for reference.  ? ?Imaging Studies: ?CT Head Wo Contrast ? ?Result Date: 10/04/2021 ?CLINICAL DATA:  Headache EXAM: CT HEAD WITHOUT CONTRAST TECHNIQUE: Contiguous axial images were obtained from the base of the skull through the vertex without intravenous contrast. RADIATION DOSE REDUCTION: This exam was performed according to the departmental dose-optimization program which includes automated exposure control, adjustment of the mA and/or kV according to patient size and/or  use of iterative reconstruction technique. COMPARISON:  None Available. FINDINGS: Brain: No acute intracranial abnormality. Specifically, no hemorrhage, hydrocephalus, mass lesion, acute infarction, or significant intracranial injury. Vascular: No hyperdense vessel or unexpected calcification. Skull: No acute calvarial abnormality. Sinuses/Orbits: No acute findings Other: None IMPRESSION: Normal study Electronically Signed   By: Rolm Baptise M.D.   On: 10/04/2021 22:10  ? ?CT ABDOMEN PELVIS W CONTRAST ? ?Result Date: 10/05/2021 ?CLINICAL DATA:  Low back pain, fever EXAM: CT ABDOMEN AND PELVIS WITH CONTRAST TECHNIQUE: Multidetector CT imaging of the abdomen and pelvis was performed using the standard protocol following bolus administration of intravenous contrast. RADIATION DOSE REDUCTION: This exam was performed according to the departmental dose-optimization program which includes automated exposure control, adjustment of the mA and/or kV according to patient size and/or use of iterative reconstruction technique. CONTRAST:  190m  OMNIPAQUE IOHEXOL 300 MG/ML  SOLN COMPARISON:  None Available. FINDINGS: Lower chest: No acute findings Hepatobiliary: Ill-defined low-density lesion in the medial segment of the left hepatic lobe measures 4.1 cm. Appearance is con

## 2021-10-08 NOTE — TOC Initial Note (Signed)
Transition of Care (TOC) - Initial/Assessment Note  ? ? ?Patient Details  ?Name: Jonathon Johnston ?MRN: [id number] ?Date of Birth: 04-01-1971 ? ?Transition of Care (TOC) CM/SW Contact:    ?Shelbie Hutching, RN ?Phone Number: ?10/08/2021, 10:11 AM ? ?Clinical Narrative:                 ?Patient admitted to the hospital for hepatic abcess, he will require IV antibiotic at discharge.  RNCM met with patient at the bedside introduced self and explained role in DC planning.  Patient lives alone in [place], he is independent and drives.  He will be able to work from home at discharge but will need a doctors note with that recommendation. ?Patient's son is visiting from [place], he will stay through the weekend, patient's daughter lives in [place] but will also be available to assist patient and she will be picking him up from the hospital at discharge.  Patient's mother will also be coming up from [place] to stay with patient for as long as he needs, she is retired. ?RNCM has reached out to [facility] with [facility] for IV antibiotics at home, Pam will arranged [place] through [employer] or [facility].  PICC line should be placed today. ? ? ?Expected Discharge Plan: [place] ?Barriers to Discharge: Continued Medical Work up ? ? ?Patient Goals and CMS Choice ?Patient states their goals for this hospitalization and ongoing recovery are:: to get back home ?CMS Medicare.gov Compare Post Acute Care list provided to:: Patient ?Choice offered to / list presented to : Patient ? ?Expected Discharge Plan and Services ?Expected Discharge Plan: [place] ?  ?Discharge Planning Services: CM Consult ?Post Acute Care Choice: Home Health ?Living arrangements for the past 2 months: [place] ?                ?DME Arranged: N/A ?DME Agency: NA ?  ?  ?  ?HH Arranged: RN, IV Antibiotics ?[place] Agency: [facility] ([facility]) ?Date HH Agency Contacted: 10/08/21 ?Time [place]: [phone] ?Representative spoke with at [place]: Carolynn Sayers ? ?Prior Living Arrangements/Services ?Living arrangements for the past 2 months: [place] ?Lives with:: Self ?Patient language and need for interpreter reviewed:: Yes ?Do you feel safe going back to the place where you live?: Yes      ?Need for Family Participation in Patient Care: Yes (Comment) ?Care giver support system in place?: Yes (comment) (son, daughter, mother) ?  ?Criminal Activity/Legal Involvement Pertinent to Current Situation/Hospitalization: No - Comment as needed ? ?Activities of Daily Living ?Home Assistive Devices/Equipment: None ?ADL Screening (condition at time of admission) ?Patient's cognitive ability adequate to safely complete daily activities?: Yes ?Is the patient deaf or have difficulty hearing?: No ?Does the patient have difficulty seeing, even when wearing glasses/contacts?: No ?Does the patient have difficulty concentrating, remembering, or making decisions?: No ?Patient able to express need for assistance with ADLs?: No ?Does the patient have difficulty dressing or bathing?: No ?Independently performs ADLs?: Yes (appropriate for developmental age) ?Does the patient have difficulty walking or climbing stairs?: No ?Weakness of Legs: None ?Weakness of Arms/Hands: None ? ?Permission Sought/Granted ?Permission sought to share information with : Case Manager, Other (comment) ?Permission granted to share information with : Yes, Verbal Permission Granted ?   ? Permission granted to share info w AGENCY: [facility] ?   ?   ? ?Emotional Assessment ?Appearance:: Appears stated age ?Attitude/Demeanor/Rapport:  Engaged ?Affect (typically observed): Accepting ?Orientation: : Oriented to Self, Oriented to Place, Oriented to  Time, Oriented to Situation ?Alcohol / Substance Use: Not Applicable ?Psych Involvement: No (comment) ? ?Admission diagnosis:  Fever [R50.9] ?Fever in other diseases [R50.81] ?Patient  Active Problem List  ? Diagnosis Date Noted  ? Hypotension 10/06/2021  ? Fever 10/05/2021  ? Hepatic abscess 10/05/2021  ? Depression 10/05/2021  ? Dyslipidemia 10/05/2021  ? Hypothyroidism 10/05/2021  ? GERD (gastroesophageal reflux disease) 10/05/2021  ? AKI (acute kidney injury) ([place]) 10/05/2021  ? Left hamstring muscle strain 12/23/2016  ? Right shoulder pain 12/23/2016  ? Right thigh pain 02/18/2014  ? Pes planus of both feet 02/18/2014  ? ?PCP:  Marda Stalker, PA-C ?Pharmacy:   ?[facility] ?[address] ?[place] [address] ?Phone: [phone] Fax: [phone] ? ?[id number] [place]?[address]?[id number]?Phone: [phone]: [phone]? ? ? ? ?Social Determinants of Health (SDOH) Interventions ?  ? ?Readmission Risk Interventions ?   ? View : No data to display.  ?  ?  ?  ? ? ? ?

## 2021-10-09 LAB — CULTURE, BLOOD (ROUTINE X 2)
Culture: NO GROWTH
Culture: NO GROWTH
Special Requests: ADEQUATE
Special Requests: ADEQUATE

## 2021-10-09 LAB — ACID FAST SMEAR (AFB, MYCOBACTERIA): Acid Fast Smear: NEGATIVE

## 2021-10-10 LAB — AEROBIC/ANAEROBIC CULTURE W GRAM STAIN (SURGICAL/DEEP WOUND)
Culture: NO GROWTH
Gram Stain: NONE SEEN

## 2021-10-11 LAB — BRUCELLA ANTIBODY IGM, EIA: Brucella Antibody IgM, EIA: NEGATIVE

## 2021-10-11 LAB — BRUCELLA ANTIBODY IGG, EIA: Brucella Antibody IgG, EIA: NEGATIVE

## 2021-10-12 LAB — BARTONELLA ANTIBODY PANEL
B Quintana IgM: NEGATIVE titer
B henselae IgG: NEGATIVE titer
B henselae IgM: NEGATIVE titer
B quintana IgG: NEGATIVE titer

## 2021-10-12 LAB — MISC LABCORP TEST (SEND OUT): Labcorp test code: 164284

## 2021-10-12 LAB — FUNGITELL, SERUM: Fungitell Result: <31 pg/mL (ref <80)

## 2021-10-13 LAB — QUANTIFERON-TB GOLD PLUS (RQFGPL)
QuantiFERON Mitogen Value: 9.93 IU/mL
QuantiFERON Nil Value: 0.01 IU/mL
QuantiFERON TB1 Ag Value: 0 IU/mL
QuantiFERON TB2 Ag Value: 0 IU/mL

## 2021-10-13 LAB — QUANTIFERON-TB GOLD PLUS: QuantiFERON-TB Gold Plus: NEGATIVE

## 2021-10-14 LAB — SURGICAL PATHOLOGY

## 2021-10-26 ENCOUNTER — Ambulatory Visit: Payer: 59 | Attending: Infectious Diseases | Admitting: Infectious Diseases

## 2021-10-26 ENCOUNTER — Encounter: Payer: Self-pay | Admitting: Infectious Diseases

## 2021-10-26 VITALS — BP 122/80 | HR 86 | Temp 98.1°F | Ht 72.0 in | Wt 214.0 lb

## 2021-10-26 DIAGNOSIS — Z79899 Other long term (current) drug therapy: Secondary | ICD-10-CM | POA: Insufficient documentation

## 2021-10-26 DIAGNOSIS — Z792 Long term (current) use of antibiotics: Secondary | ICD-10-CM | POA: Insufficient documentation

## 2021-10-26 DIAGNOSIS — K750 Abscess of liver: Secondary | ICD-10-CM | POA: Diagnosis present

## 2021-10-26 NOTE — Progress Notes (Signed)
NAME: Jonathon Johnston  DOB: 12-03-1970  MRN: [id number]  Date/Time: 10/26/2021 9:15 AM  Subjective:   ? Jonathon Johnston is a 51 y.o. male Pt here for follow up after recent hospitalization between 10/04/2021 until 10/08/2021 for fever and   headache.  In the ED and LP was attempted but was not successful The CT scan of the abdomen showed a 4 cm ill-defined low-density in the left hepatic lobe concerning for hepatic abscess.  On 10/05/2021 interventional radiology did a biopsy of the liver lesion and found that it was not abscess but the pathology showed focal hepatic parenchyma disrupted by dense fibrosis, mixed granulomatous and acute suppurative inflammation and necroinflammatory debris compatible with hepatic abscess.  AFB and GMS special stains were negative for acid-fast and fungal organisms.  No spirochetes were seen in the [place]-Starry special stain.  The biopsy bacterial culture was negative.  Fungal culture and AFB culture also sent and so far negative.  Other work-up including Brucella antibody, RPR, QuantiFERON gold, HIV, Bartonella, Coxiella, fungal antibodies, Fungitell were all negative.  Patient responded to ceftriaxone and Flagyl and on discharge was sent on IV ceftriaxone to be continued for 30 days until 11/02/2021 and p.o. Flagyl. Patient is here for follow-up He is got some nausea Appetite is fair but not as robust as before No fever or chills or night sweats No pain abdomen is 100% adherent to his medication.  Past Medical History:  Diagnosis Date   GERD (gastroesophageal reflux disease)   Depression Past Surgical History:  Procedure Laterality Date   APPENDECTOMY     IR US GUIDE BX ASP/DRAIN  10/05/2021    Social History   Socioeconomic History   Marital status: Married    Spouse name: Not on file   Number of children: Not on file   Years of education: Not on file   Highest education level: Not on file  Occupational History   Not on file  Tobacco Use   Smoking  status: Never   Smokeless tobacco: Never  Substance and Sexual Activity   Alcohol use: No    Comment: occasionally    Drug use: No   Sexual activity: Not on file  Other Topics Concern   Not on file  Social History Narrative   Not on file   Social Determinants of Health   Financial Resource Strain: Not on file  Food Insecurity: Not on file  Transportation Needs: Not on file  Physical Activity: Not on file  Stress: Not on file  Social Connections: Not on file  Intimate Partner Violence: Not on file    No family history on file. No Known Allergies I? Current Outpatient Medications  Medication Sig Dispense Refill   buPROPion (WELLBUTRIN XL) 300 MG 24 hr tablet Take 300 mg by mouth every morning.     cefTRIAXone (ROCEPHIN) IVPB Inject 2 g into the vein daily for 24 days. Indication:  Hepatic abscess First Dose: Yes Last Day of Therapy:  11/02/2021 Labs - Once weekly:  CBC/D, CMP, and CRP Please pull PIC at completion of IV antibiotics Fax weekly labs to [phone] Method of administration: IV Push Method of administration may be changed at the discretion of home infusion pharmacist based upon assessment of the patient and/or caregiver's ability to self-administer the medication ordered. 25 Units 0   citalopram (CELEXA) 20 MG tablet Take 20 mg by mouth at bedtime.     eszopiclone (LUNESTA) 1 MG TABS tablet Take 1 mg by mouth at bedtime.  levothyroxine (SYNTHROID) 50 MCG tablet Take 50 mcg by mouth every morning.     metroNIDAZOLE (FLAGYL) 500 MG tablet Take 1 tablet (500 mg total) by mouth 2 (two) times daily for 25 days. 50 tablet 0   Multiple Vitamin (ONE DAILY) tablet Take 1 tablet by mouth daily.     omeprazole (PRILOSEC) 20 MG capsule 1 capsule 30 minutes before morning meal     ondansetron (ZOFRAN) 4 MG tablet Take 1 tablet (4 mg total) by mouth every 6 (six) hours as needed for nausea. 20 tablet 0   rosuvastatin (CRESTOR) 10 MG tablet Take 10 mg by mouth daily.      sodium chloride flush (NS) 0.9 % SOLN 10 mLs by Intracatheter route every 12 (twelve) hours for 25 days. 50 Syringe 0   temazepam (RESTORIL) 15 MG capsule Take 15 mg by mouth daily as needed.     BELSOMRA 10 MG TABS Take 2 tablets by mouth at bedtime. (Patient not taking: Reported on 10/26/2021)     No current facility-administered medications for this visit.     Abtx:  Anti-infectives (From admission, onward)    None       REVIEW OF SYSTEMS:  Const: negative fever, negative chills, negative weight loss Eyes: negative diplopia or visual changes, negative eye pain ENT: negative coryza, negative sore throat Resp: negative cough, hemoptysis, dyspnea Cards: negative for chest pain, palpitations, lower extremity edema GU: negative for frequency, dysuria and hematuria GI: Nausea negative for abdominal pain, diarrhea, bleeding, constipation Skin: negative for rash and pruritus Heme: negative for easy bruising and gum/nose bleeding MS: Fatigue, and takes nap every day Neurolo:negative for headaches, dizziness, vertigo, memory problems  Psych: depression  Endocrine: negative for thyroid, diabetes Allergy/Immunology- negative for any medication or food allergies ?  Objective:  VITALS:  BP 122/80   Pulse 86   Temp 98.1 F (36.7 C) (Temporal)   Ht 6' (1.829 m)   Wt 214 lb (97.1 kg)   BMI 29.02 kg/m  LDA Right PICC line PHYSICAL EXAM:  General: Alert, cooperative, no distress, appears stated age.  Head: Normocephalic, without obvious abnormality, atraumatic. Eyes: Conjunctivae clear, anicteric sclerae. Pupils are equal ENT Nares normal. No drainage or sinus tenderness. Lips, mucosa, and tongue normal. No Thrush Neck: Supple, symmetrical, no adenopathy, thyroid: non tender no carotid bruit and no JVD. Back: No CVA tenderness. Lungs: Clear to auscultation bilaterally. No Wheezing or Rhonchi. No rales. Heart: Regular rate and rhythm, no murmur, rub or gallop. Abdomen: Soft,  non-tender,not distended. Bowel sounds normal. No masses Extremities: atraumatic, no cyanosis. No edema. No clubbing Skin: No rashes or lesions. Or bruising Lymph: Cervical, supraclavicular normal. Neurologic: Grossly non-focal Pertinent Labs  ? Impression/Recommendation ? Liver abscess.  More necrosis than abscess.  Pathology confirmed acute i and admission.  Cultures have all been negative.  Also extensive work-up for different reasons for having a necrotic lesion in the liver was negative including Coxiella, Bartonella, Brucella, fungal.  Bacterial culture negative.  AFB and fungal culture of the liver biopsy pending but negative so far. ? Continue ceftriaxone and metronidazole until completion on 11/02/2021.  PICC line can be removed after that We will get an ultrasound of the liver to look at the liver lesion.  Past history of pleuritis with pleural effusion.  Autoimmune screen done on 10/07/2021 was negative. ___________________________________________________ Discussed with patient, in great detail.  Once he has the ultrasound we will discuss the results with him.  Follow-up as needed. Note:  This  document was prepared using Systems analyst and may include unintentional dictation errors.

## 2021-10-26 NOTE — Patient Instructions (Addendum)
You are here for follow up of liver abscess- you are on IV ceftriaxone and PO flagyl- you have some nasuea which is likely due to the metronidazole- you will complete 30 days on 11/02/21 and PICC will be removed- today I anm ordering an ultrasound to look for clearance of abscess

## 2021-10-28 LAB — CULTURE, FUNGUS WITHOUT SMEAR

## 2021-11-03 ENCOUNTER — Ambulatory Visit
Admission: RE | Admit: 2021-11-03 | Discharge: 2021-11-03 | Disposition: A | Discharge status: Home / Self Care | Payer: 59 | Source: Ambulatory Visit | Attending: Infectious Diseases | Admitting: Infectious Diseases

## 2021-11-03 DIAGNOSIS — K750 Abscess of liver: Secondary | ICD-10-CM | POA: Insufficient documentation

## 2021-11-05 ENCOUNTER — Telehealth: Payer: Self-pay

## 2021-11-05 NOTE — Telephone Encounter (Signed)
Error

## 2021-11-05 NOTE — Telephone Encounter (Signed)
Called patient to communicate results per Dr. Delaine Lame. No answer. Left HIPAA-compliant voicemail requesting call back. Will also send patient detailed MyChart message.  Binnie Kand, RN

## 2021-11-20 LAB — ACID FAST CULTURE WITH REFLEXED SENSITIVITIES (MYCOBACTERIA): Acid Fast Culture: NEGATIVE

## 2022-09-11 ENCOUNTER — Emergency Department
Admission: EM | Admit: 2022-09-11 | Discharge: 2022-09-11 | Disposition: A | Discharge status: Home / Self Care | Payer: 59 | Attending: Emergency Medicine | Admitting: Emergency Medicine

## 2022-09-11 ENCOUNTER — Other Ambulatory Visit: Payer: Self-pay

## 2022-09-11 ENCOUNTER — Encounter: Payer: Self-pay | Admitting: Emergency Medicine

## 2022-09-11 DIAGNOSIS — Z23 Encounter for immunization: Secondary | ICD-10-CM | POA: Insufficient documentation

## 2022-09-11 DIAGNOSIS — W540XXA Bitten by dog, initial encounter: Secondary | ICD-10-CM | POA: Insufficient documentation

## 2022-09-11 DIAGNOSIS — S31813A Puncture wound without foreign body of right buttock, initial encounter: Secondary | ICD-10-CM | POA: Insufficient documentation

## 2022-09-11 DIAGNOSIS — S3992XA Unspecified injury of lower back, initial encounter: Secondary | ICD-10-CM | POA: Diagnosis present

## 2022-09-11 MED ORDER — AMOXICILLIN-POT CLAVULANATE 875-125 MG PO TABS: 1.0000 | ORAL_TABLET | Freq: Two times a day (BID) | ORAL | 0 refills | Status: AC

## 2022-09-11 MED ORDER — ONDANSETRON 4 MG PO TBDP: 4.0000 mg | ORAL_TABLET | Freq: Three times a day (TID) | ORAL | 0 refills | Status: AC | PRN

## 2022-09-11 MED ORDER — HYDROCODONE-ACETAMINOPHEN 5-325 MG PO TABS: 1.0000 | ORAL_TABLET | Freq: Four times a day (QID) | ORAL | 0 refills | Status: AC | PRN

## 2022-09-11 MED ORDER — TETANUS-DIPHTH-ACELL PERTUSSIS 5-2.5-18.5 LF-MCG/0.5 IM SUSY
0.5000 mL | PREFILLED_SYRINGE | Freq: Once | INTRAMUSCULAR | Status: AC
  Administered 2022-09-11: 0.5 mL via INTRAMUSCULAR
  Filled 2022-09-11: qty 0.5

## 2022-09-11 NOTE — ED Notes (Signed)
Animal control contacted and patient's information given to the dispatch. Patient is aware that animal control has been called.

## 2022-09-11 NOTE — ED Triage Notes (Signed)
Pt reports he was bitten by a dog on the right buttock. Pt reports it was a neighbors dog. Dog is up to date on his vaccinations.

## 2022-09-11 NOTE — ED Provider Notes (Signed)
[facility] Provider Note  Patient Contact: 6:54 PM (approximate)   History   Animal Bite   HPI  Jonathon Johnston is a 52 y.o. male presents to the emergency department with a dog bite after he was bitten by his neighbors dog.  Dog is up-to-date on rabies and incident was reported to animal control.  Patient has superficial puncture wounds and 1, 1 and half centimeter puncture wounds along the right buttocks.  Tetanus status needs updating.      Physical Exam   Triage Vital Signs: ED Triage Vitals [09/11/22 1838]  Enc Vitals Group     BP 121/79     Pulse Rate 83     Resp 15     Temp 98.3 F (36.8 C)     Temp Source Oral     SpO2 98 %     Weight      Height      Head Circumference      Peak Flow      Pain Score 3     Pain Loc      Pain Edu?      Excl. in GC?     Most recent vital signs: Vitals:   09/11/22 1838  BP: 121/79  Pulse: 83  Resp: 15  Temp: 98.3 F (36.8 C)  SpO2: 98%     General: Alert and in no acute distress. Eyes:  PERRL. EOMI. Head: No acute traumatic findings ENT:      Nose: No congestion/rhinnorhea.      Mouth/Throat: Mucous membranes are moist.  Neck: No stridor. No cervical spine tenderness to palpation. Cardiovascular:  Good peripheral perfusion Respiratory: Normal respiratory effort without tachypnea or retractions. Lungs CTAB. Good air entry to the bases with no decreased or absent breath sounds. Gastrointestinal: Bowel sounds 4 quadrants. Soft and nontender to palpation. No guarding or rigidity. No palpable masses. No distention. No CVA tenderness. Musculoskeletal: Full range of motion to all extremities.  Neurologic:  No gross focal neurologic deficits are appreciated.  Skin: Patient has superficial puncture wounds at right buttocks and a deeper, 1-1/2 cm puncture wound. Other:   ED Results / Procedures / Treatments   Labs (all labs ordered are listed, but only abnormal results are displayed) Labs  Reviewed - No data to display     PROCEDURES:  Critical Care performed: No  ..Laceration Repair  Date/Time: 09/11/2022 6:56 PM  Performed by: Orvil Feil, PA-C Authorized by: Orvil Feil, PA-C   Consent:    Consent obtained:  Verbal   Risks discussed:  Infection and pain Universal protocol:    Procedure explained and questions answered to patient or proxy's satisfaction: yes     Patient identity confirmed:  Verbally with patient Anesthesia:    Anesthesia method:  None Laceration details:    Location:  Anogenital   Anogenital location: buttocks.   Length (cm):  1.5   Depth (mm):  5 Pre-procedure details:    Preparation:  Patient was prepped and draped in usual sterile fashion Exploration:    Limited defect created (wound extended): no     Contaminated: yes   Treatment:    Area cleansed with:  Povidone-iodine   Amount of cleaning:  Standard   Debridement:  None   Undermining:  None   Scar revision: no   Skin repair:    Repair method: derma-clip. Approximation:    Approximation:  Close Repair type:    Repair type:  Simple Post-procedure details:  Dressing:  Adhesive bandage   Procedure completion:  Tolerated well, no immediate complications    MEDICATIONS ORDERED IN ED: Medications - No data to display   IMPRESSION / MDM / ASSESSMENT AND PLAN / ED COURSE  I reviewed the triage vital signs and the nursing notes.                              Assessment and plan: Dog bite 52 year old male presents to the emergency department after he was bitten by a neighbors dog.  Incident has been reported to animal control and animal is up-to-date on rabies shots.  Vital signs are reassuring at triage.  On exam, patient had several superficial wounds and 1 deeper puncture wound that was repaired using a derma clip in the emergency department.  Patient reported that he copiously irrigated wound prior to coming to the emergency department with water and bar  soap.  His tetanus status was updated while in the emergency department.  I emphasized the importance of compliance antibiotic after discharge.  He was discharged with Augmentin twice daily for the next 7 days.  Return precautions were given to return with new or worsening symptoms.      FINAL CLINICAL IMPRESSION(S) / ED DIAGNOSES   Final diagnoses:  Dog bite, initial encounter     Rx / DC Orders   ED Discharge Orders          Ordered    amoxicillin-clavulanate (AUGMENTIN) 875-125 MG tablet  2 times daily        09/11/22 1851    HYDROcodone-acetaminophen (NORCO) 5-325 MG tablet  Every 6 hours PRN        09/11/22 1851    ondansetron (ZOFRAN-ODT) 4 MG disintegrating tablet  Every 8 hours PRN        09/11/22 1851             Note:  This document was prepared using Dragon voice recognition software and may include unintentional dictation errors.   Pia Mau [place], PA-C 09/11/22 1859    Jene Every, MD 09/12/22 1501

## 2022-09-11 NOTE — Discharge Instructions (Signed)
Take Augmentin twice daily for seven days. You can take Norco for pain.

## 2022-10-10 ENCOUNTER — Other Ambulatory Visit: Payer: Self-pay | Admitting: Nurse Practitioner

## 2022-10-10 DIAGNOSIS — K750 Abscess of liver: Secondary | ICD-10-CM

## 2022-10-10 DIAGNOSIS — R748 Abnormal levels of other serum enzymes: Secondary | ICD-10-CM

## 2022-10-27 ENCOUNTER — Ambulatory Visit
Admission: RE | Admit: 2022-10-27 | Discharge: 2022-10-27 | Disposition: A | Discharge status: Home / Self Care | Payer: 59 | Source: Ambulatory Visit | Attending: Nurse Practitioner | Admitting: Nurse Practitioner

## 2022-10-27 DIAGNOSIS — K750 Abscess of liver: Secondary | ICD-10-CM

## 2022-10-27 DIAGNOSIS — R748 Abnormal levels of other serum enzymes: Secondary | ICD-10-CM

## 2023-11-18 IMAGING — CT CT ANGIO CHEST
2 of 7 series · 18 of 46 positions shown · IV contrast (APPLIED)
Comparison: None.

CLINICAL DATA: Cough and congestion

EXAM:
CT ANGIOGRAPHY CHEST WITH CONTRAST
TECHNIQUE: Multidetector CT imaging of the chest was performed using the
standard protocol during bolus administration of intravenous
contrast. Multiplanar CT image reconstructions and MIPs were
obtained to evaluate the vascular anatomy.
RADIATION DOSE REDUCTION: This exam was performed according to the
departmental dose-optimization program which includes automated
exposure control, adjustment of the mA and/or kV according to
patient size and/or use of iterative reconstruction technique.
CONTRAST:  73 mL OMNIPAQUE 350

[Series 5: thins · axial · 0.93mm/px · z∈[+92,+378]mm · 15 of 397 slices shown]
[im 20/397  lung]
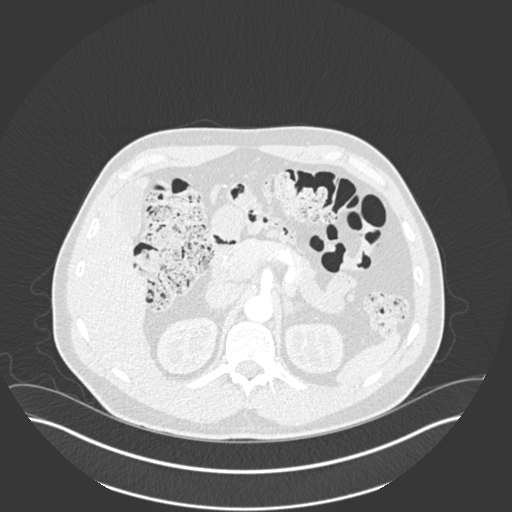
[im 40/397  soft-tissue]
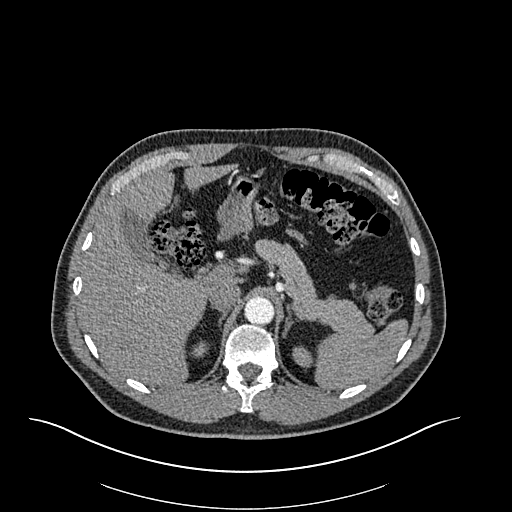
[im 80/397  lung]
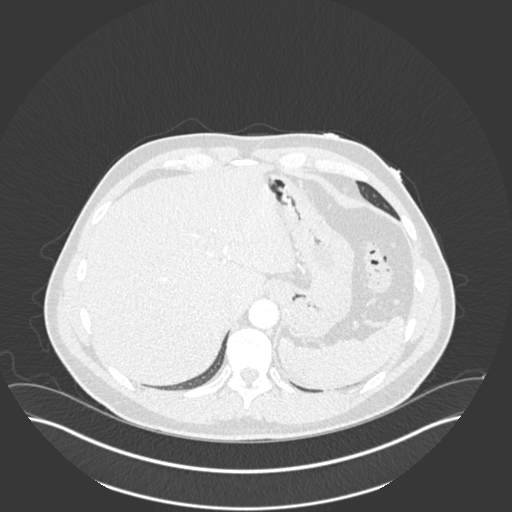
[im 100/397  soft-tissue]
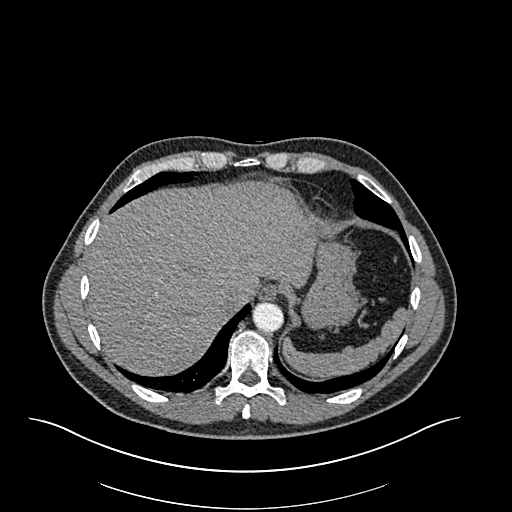
[im 119/397  lung]
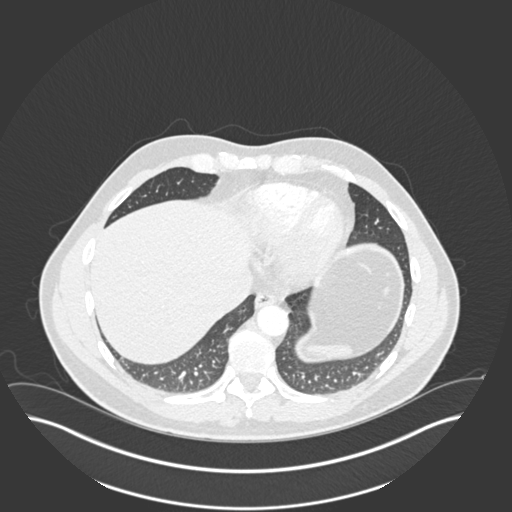
[im 139/397  soft-tissue]
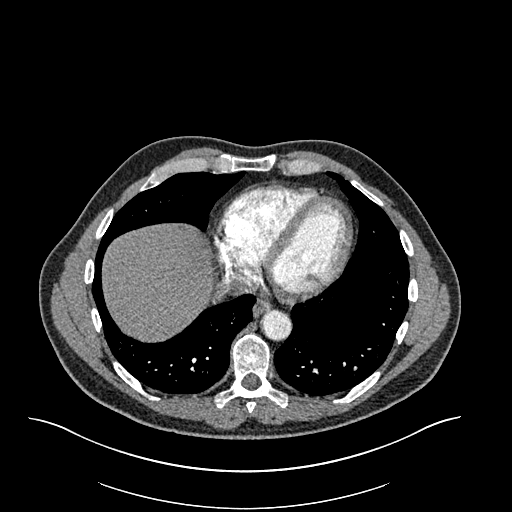
[im 179/397  lung]
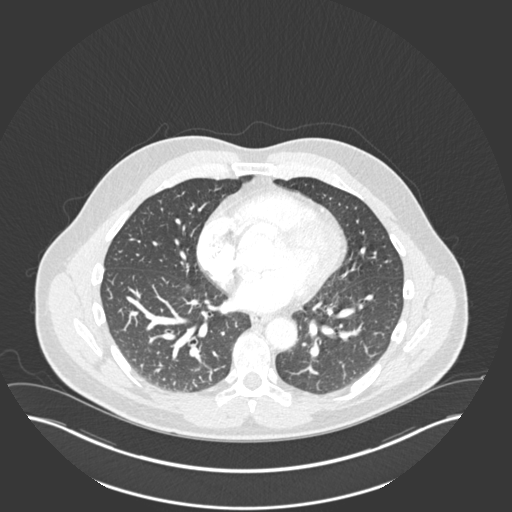
[im 199/397  soft-tissue]
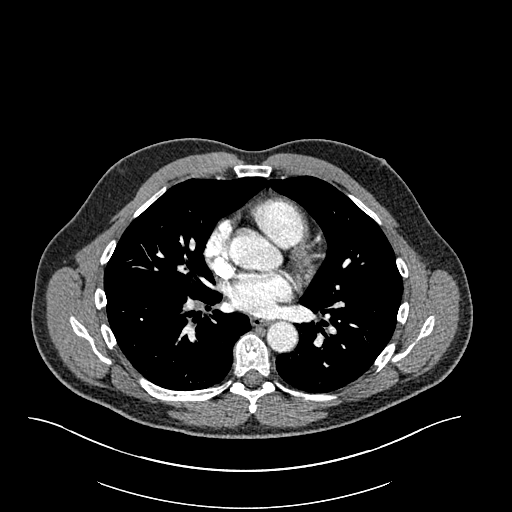
[im 218/397  lung]
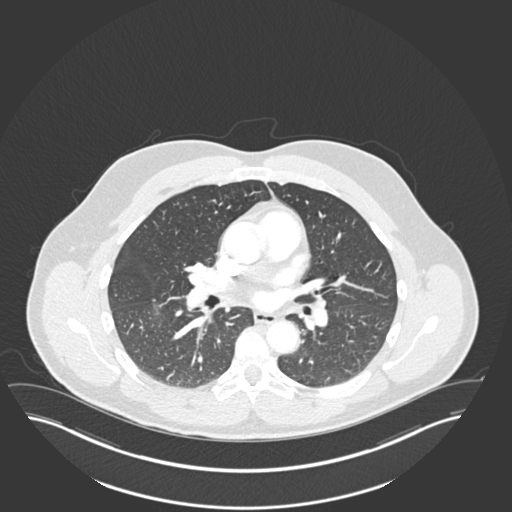
[im 258/397  soft-tissue]
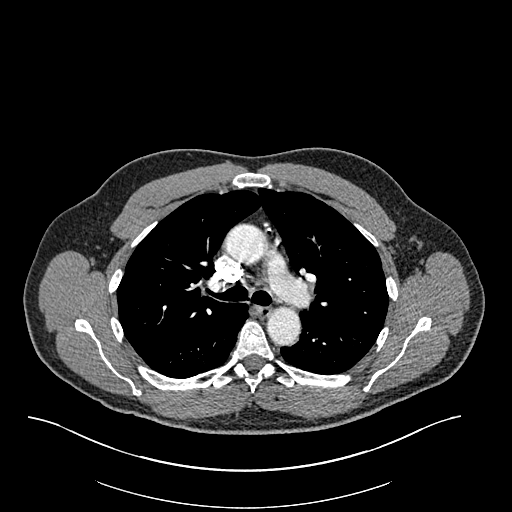
[im 278/397  lung]
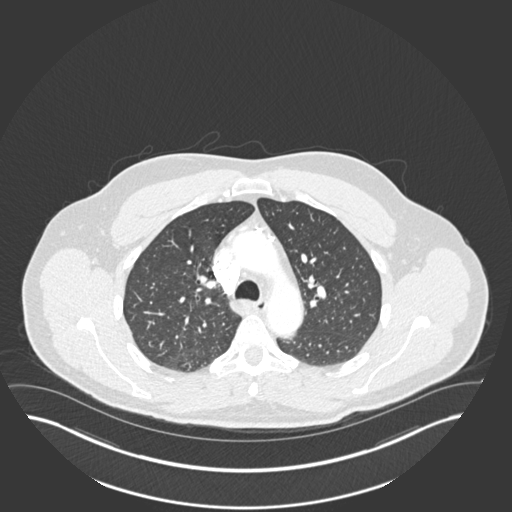
[im 298/397  soft-tissue]
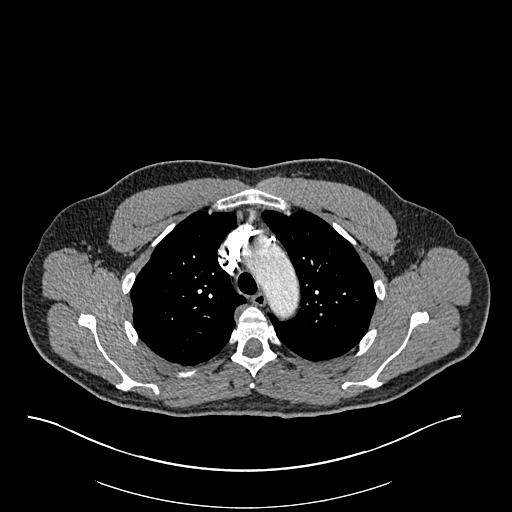
[im 317/397  lung]
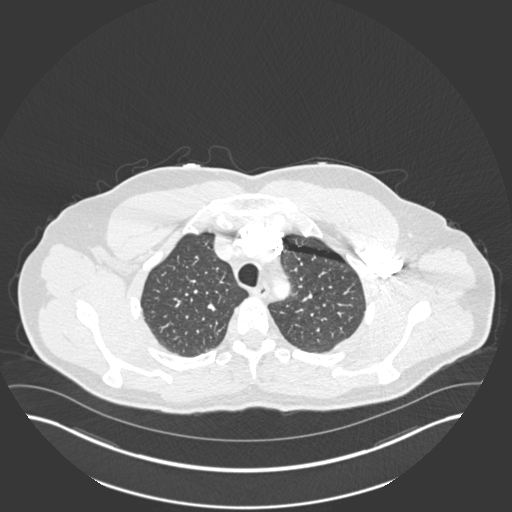
[im 357/397  soft-tissue]
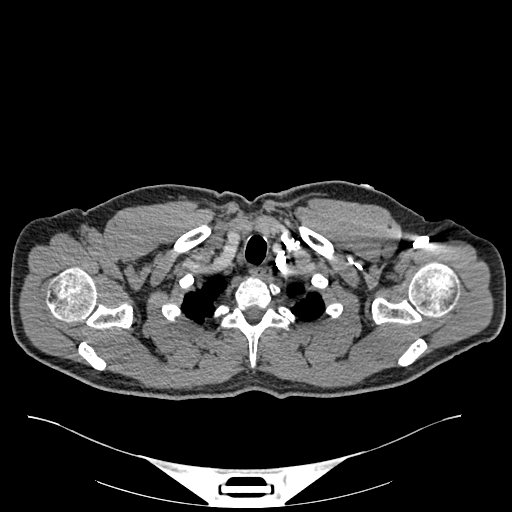
[im 377/397  lung]
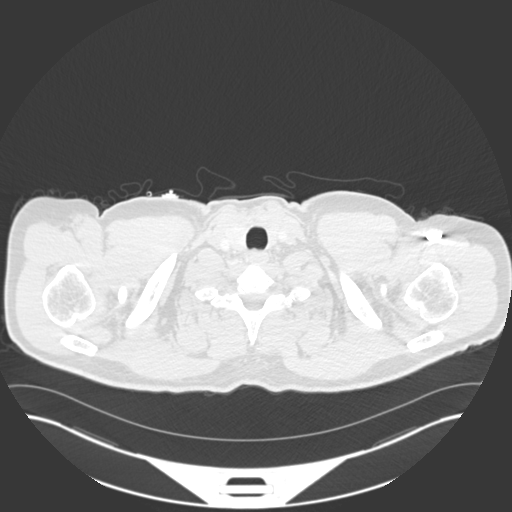

[Series 7: coronal mpr · coronal · 0.62mm/px · 3 of 106 slices shown]
[im 27/106  soft-tissue]
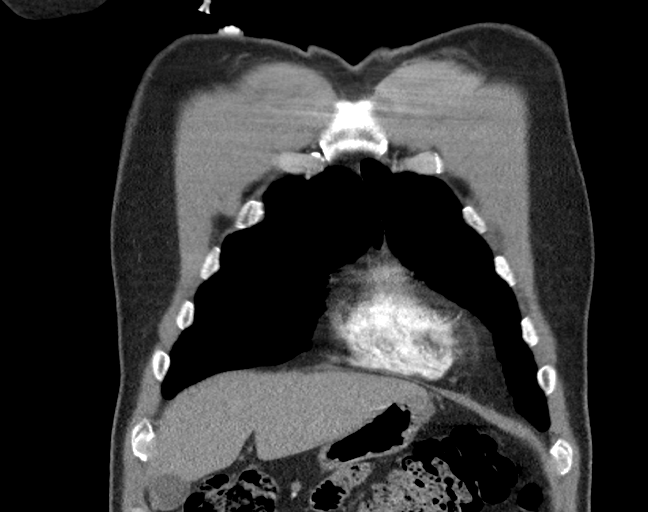
[im 53/106  soft-tissue]
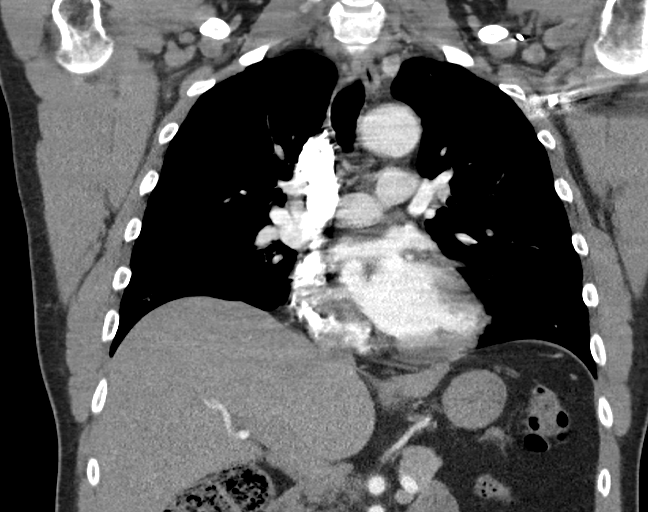
[im 79/106  soft-tissue]
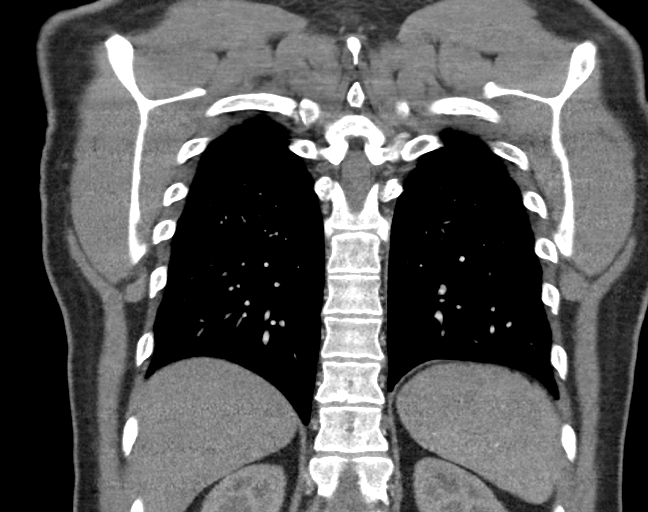

[18 of 46 positions shown; findings below may reference images not displayed]

FINDINGS: Cardiovascular: Thoracic aorta shows no aneurysmal dilatation or
dissection. No cardiac enlargement is seen. No coronary
calcifications are noted. The pulmonary artery shows a normal
branching pattern. No focal filling defect to suggest pulmonary
embolism is noted.

Mediastinum/Nodes: Thoracic inlet is within normal limits. No
sizable hilar or mediastinal adenopathy is noted. The esophagus as
visualized is within normal limits.

Lungs/Pleura: Lungs are well aerated bilaterally. No focal
infiltrate or sizable effusion is seen. No sizable parenchymal
nodules are noted.

Upper Abdomen: No acute abnormality.

Musculoskeletal: No rib abnormality is noted. No significant
abnormality in the thoracic spine is noted.

Review of the MIP images confirms the above findings.
IMPRESSION: No evidence of pulmonary emboli.

No acute abnormality noted.
